# Patient Record
Sex: Female | Born: 1990 | Race: Black or African American | Hispanic: No | Marital: Married | Smoking: Never smoker
Health system: Southern US, Community
[De-identification: ages and names within clinical notes are randomized; demographics above are authoritative.]

## PROBLEM LIST (undated history)

## (undated) DIAGNOSIS — F419 Anxiety disorder, unspecified: Secondary | ICD-10-CM

---

## 2008-08-02 ENCOUNTER — Emergency Department (HOSPITAL_BASED_OUTPATIENT_CLINIC_OR_DEPARTMENT_OTHER): Admission: EM | Admit: 2008-08-02 | Discharge: 2008-08-03 | Payer: Self-pay | Admitting: Emergency Medicine

## 2008-08-03 ENCOUNTER — Ambulatory Visit: Payer: Self-pay | Admitting: Interventional Radiology

## 2010-04-10 LAB — DIFFERENTIAL
Basophils Absolute: 0 10*3/uL (ref 0.0–0.1)
Basophils Relative: 0 % (ref 0–1)
Lymphocytes Relative: 20 % (ref 12–46)
Monocytes Absolute: 0.7 10*3/uL (ref 0.1–1.0)
Monocytes Relative: 7 % (ref 3–12)
Neutro Abs: 8.2 10*3/uL — ABNORMAL HIGH (ref 1.7–7.7)
Neutrophils Relative %: 73 % (ref 43–77)

## 2010-04-10 LAB — URINALYSIS, ROUTINE W REFLEX MICROSCOPIC
Glucose, UA: NEGATIVE mg/dL
Ketones, ur: NEGATIVE mg/dL
Nitrite: NEGATIVE
Protein, ur: NEGATIVE mg/dL

## 2010-04-10 LAB — PREGNANCY, URINE: Preg Test, Ur: POSITIVE

## 2010-04-10 LAB — COMPREHENSIVE METABOLIC PANEL
Albumin: 3.5 g/dL (ref 3.5–5.2)
Alkaline Phosphatase: 45 U/L (ref 39–117)
BUN: 7 mg/dL (ref 6–23)
Creatinine, Ser: 0.6 mg/dL (ref 0.4–1.2)
Glucose, Bld: 97 mg/dL (ref 70–99)
Total Bilirubin: 0.2 mg/dL — ABNORMAL LOW (ref 0.3–1.2)
Total Protein: 7.1 g/dL (ref 6.0–8.3)

## 2010-04-10 LAB — CBC
HCT: 35 % — ABNORMAL LOW (ref 36.0–46.0)
Hemoglobin: 11.6 g/dL — ABNORMAL LOW (ref 12.0–15.0)
MCV: 83.6 fL (ref 78.0–100.0)
Platelets: 176 10*3/uL (ref 150–400)
RDW: 15.2 % (ref 11.5–15.5)

## 2010-04-10 LAB — RPR: RPR Ser Ql: NONREACTIVE

## 2010-04-10 LAB — WET PREP, GENITAL: Yeast Wet Prep HPF POC: NONE SEEN

## 2010-12-10 IMAGING — US US ABDOMEN COMPLETE
1 series · 14 of 25 positions shown · non-contrast
Comparison: None

CLINICAL DATA: Sharp abdominal pain.  Pregnant.

COMPLETE ABDOMINAL ULTRASOUND

[Series 1: us abdomen complete · 0.22mm/px · 14 of 69 slices shown]
[im 1/69]
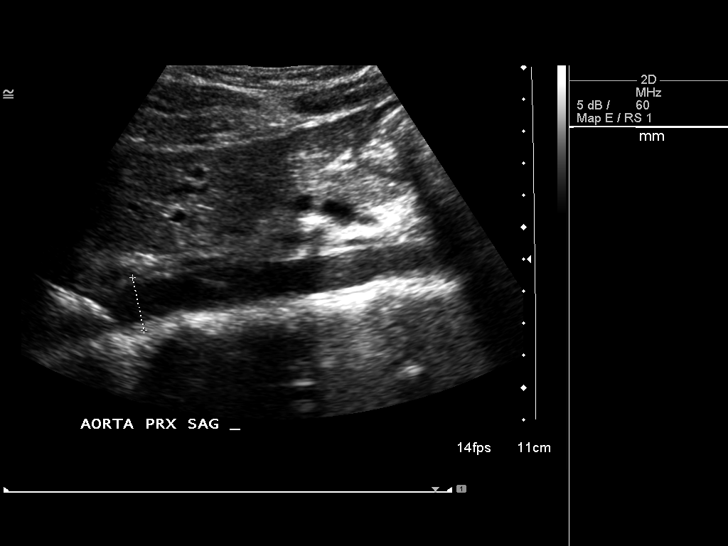
[im 6/69]
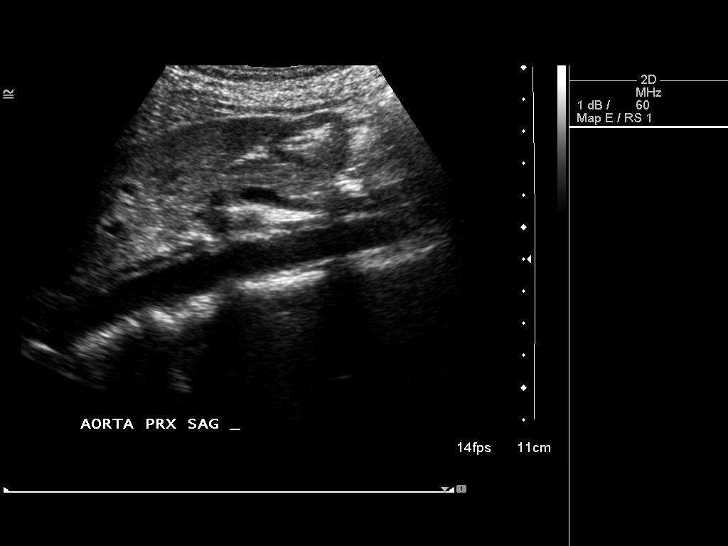
[im 12/69]
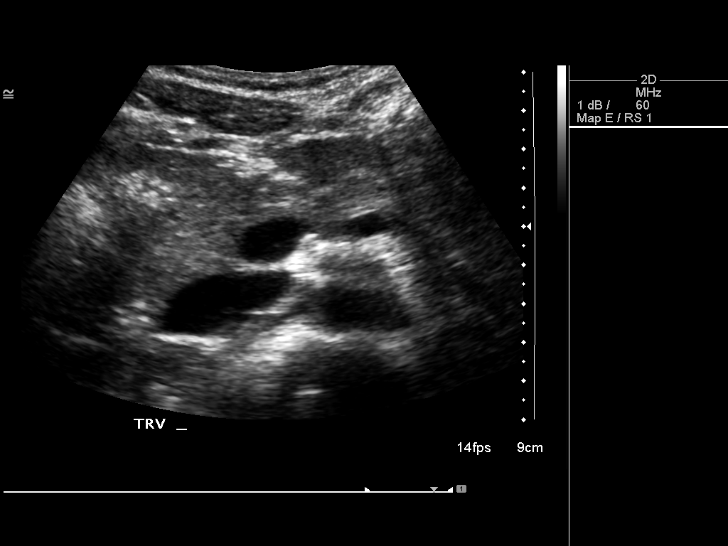
[im 18/69]
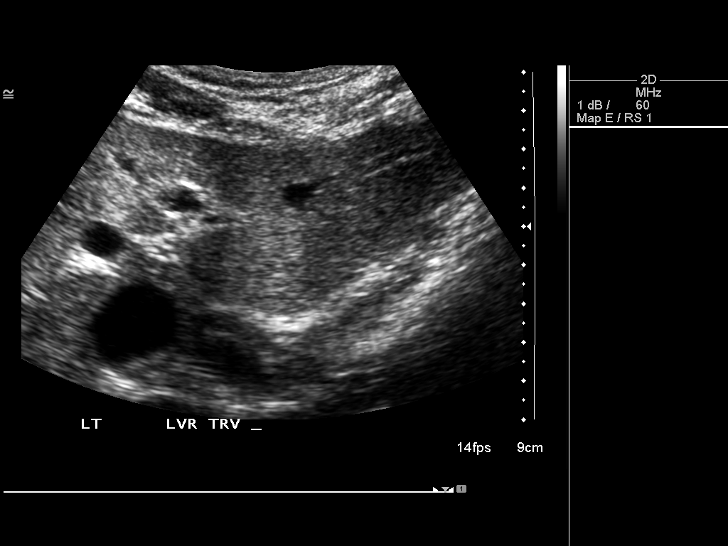
[im 23/69]
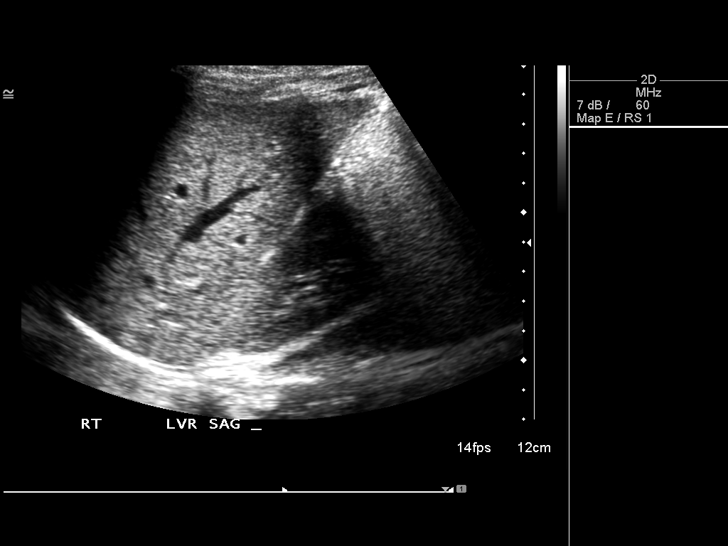
[im 26/69]
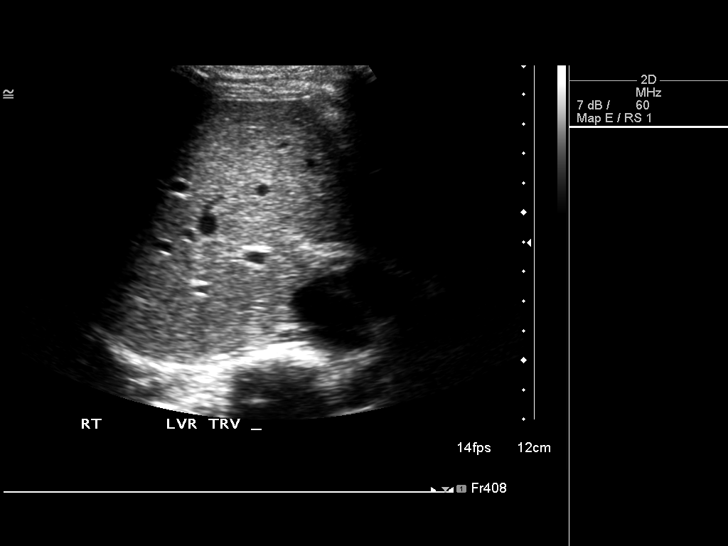
[im 32/69]
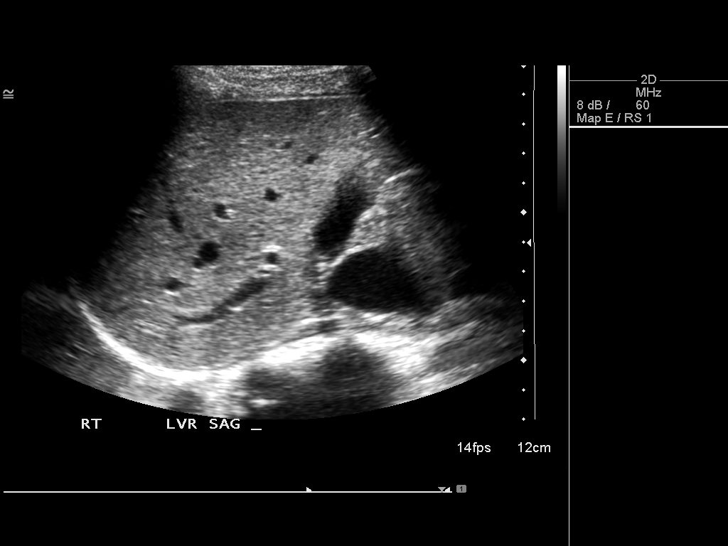
[im 37/69]
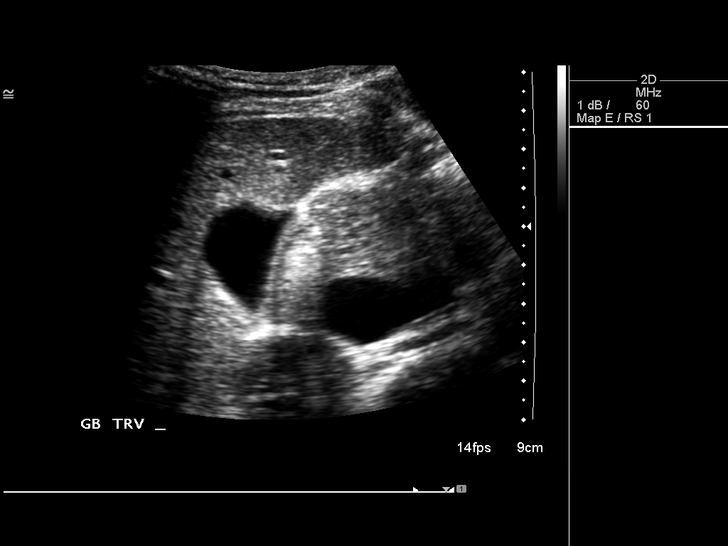
[im 43/69]
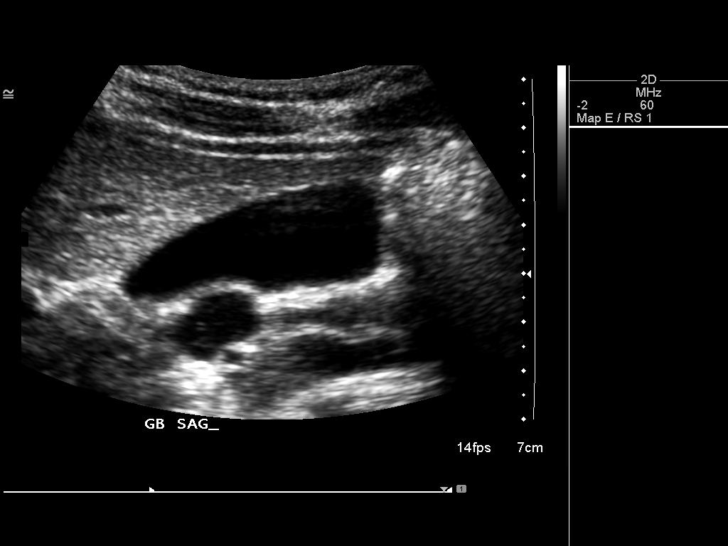
[im 46/69]
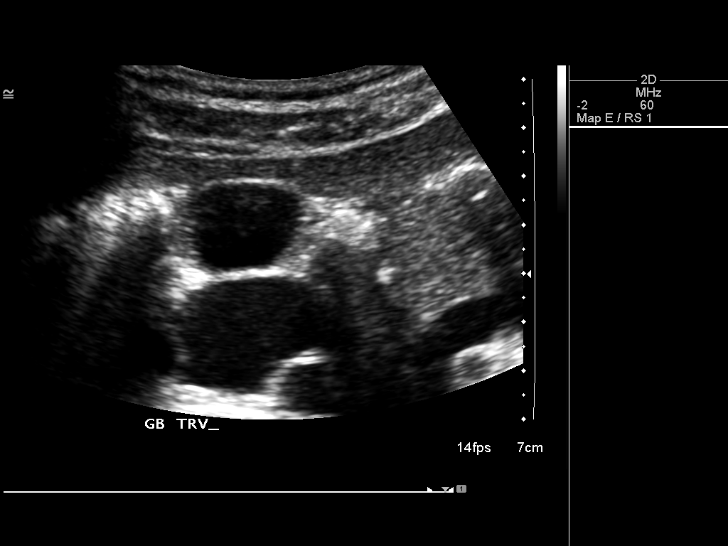
[im 52/69]
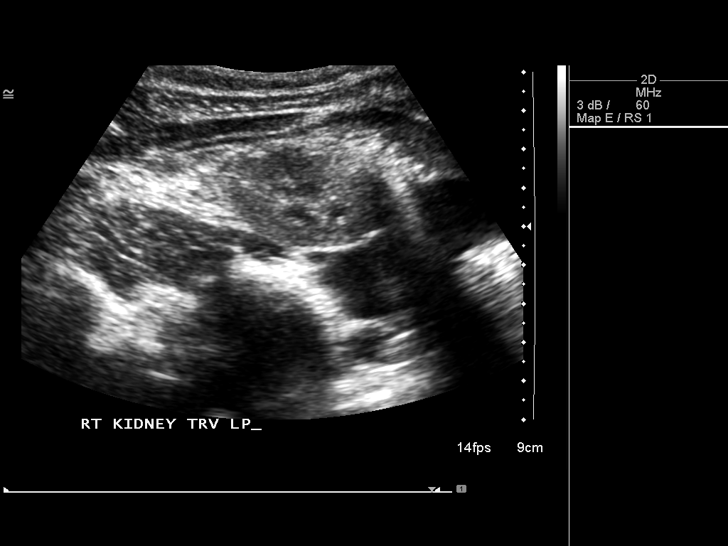
[im 57/69]
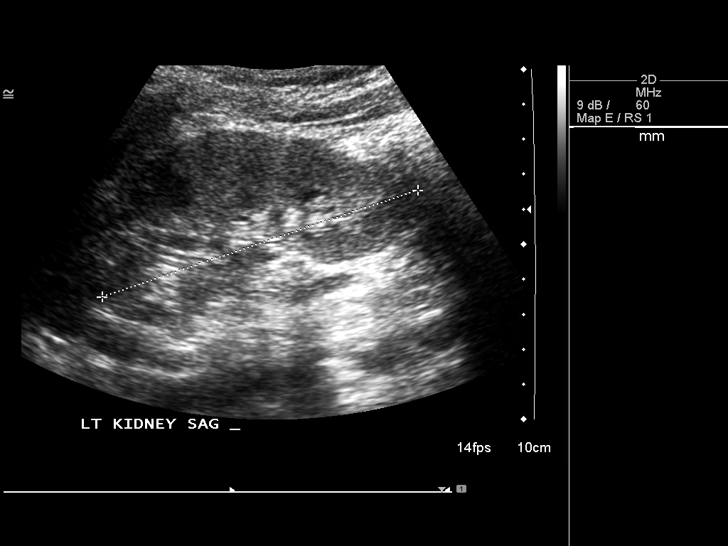
[im 63/69]
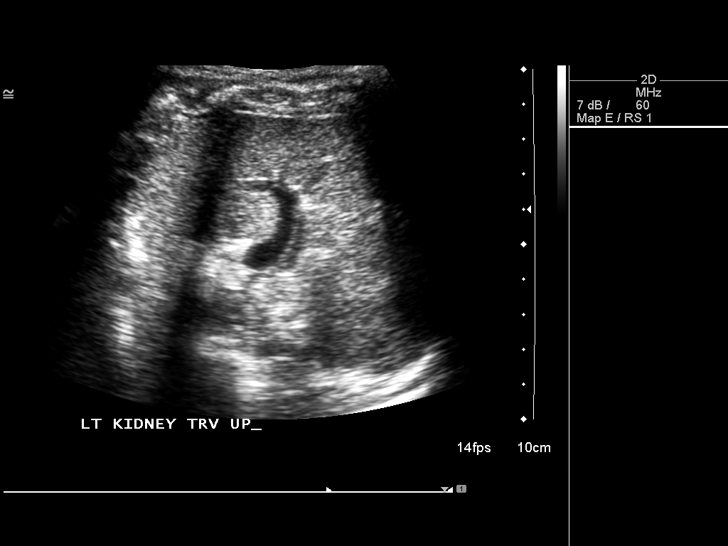
[im 69/69]
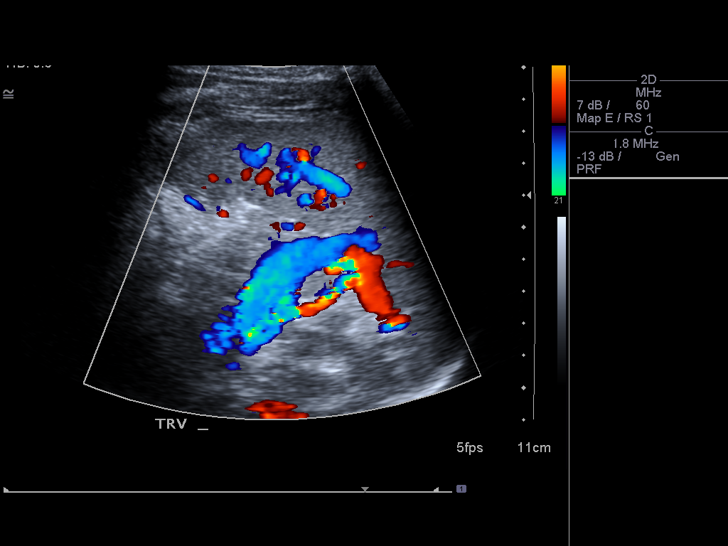

[14 of 25 positions shown; findings below may reference images not displayed]

FINDINGS: Gallbladder:  No shadowing gallstones or echogenic sludge.  No
gallbladder wall thickening or pericholecystic fluid.  Negative
sonographic Murphy's sign according to the ultrasound technologist.

Common bile duct:  Unremarkable

Liver:  Normal size and echotexture without focal parenchymal
abnormality.  Patent portal vein with hepatopetal flow.

IVC:  Visualized segments unremarkable.

Pancreas:  Although the pancreas is difficult to visualize in its
entirety, no focal pancreatic abnormality is identified.

Spleen:  Normal size and echotexture without focal parenchymal
abnormality.

Right Kidney:  There is mild right hydronephrosis.  No focal renal
lesions.  Right kidney measures at least 9.9 cm in length.

Left Kidney:  No hydronephrosis.  Well-preserved cortex.  Normal
size and parenchymal echotexture without focal abnormalities.  .

Abdominal aorta:  Normal in caliber throughout its visualized
course in the abdomen.
IMPRESSION: 1.  Normal gallbladder.
2.  Mild right hydronephrosis.

## 2015-11-01 ENCOUNTER — Encounter (HOSPITAL_COMMUNITY): Payer: Self-pay | Admitting: Emergency Medicine

## 2015-11-01 ENCOUNTER — Inpatient Hospital Stay (HOSPITAL_COMMUNITY)
Admission: AD | Admit: 2015-11-01 | Discharge: 2015-11-04 | DRG: 885 | Disposition: A | Discharge status: Home / Self Care | Payer: BLUE CROSS/BLUE SHIELD | Source: Intra-hospital | Attending: Psychiatry | Admitting: Psychiatry

## 2015-11-01 ENCOUNTER — Emergency Department (HOSPITAL_COMMUNITY)
Admission: EM | Admit: 2015-11-01 | Discharge: 2015-11-01 | Disposition: A | Discharge status: Other Acute Inpatient Hospital | Payer: BLUE CROSS/BLUE SHIELD | Attending: Emergency Medicine | Admitting: Emergency Medicine

## 2015-11-01 ENCOUNTER — Emergency Department (HOSPITAL_COMMUNITY): Payer: BLUE CROSS/BLUE SHIELD

## 2015-11-01 ENCOUNTER — Encounter (HOSPITAL_COMMUNITY): Payer: Self-pay | Admitting: *Deleted

## 2015-11-01 DIAGNOSIS — F332 Major depressive disorder, recurrent severe without psychotic features: Secondary | ICD-10-CM | POA: Diagnosis present

## 2015-11-01 DIAGNOSIS — R45851 Suicidal ideations: Secondary | ICD-10-CM | POA: Diagnosis present

## 2015-11-01 DIAGNOSIS — Z91018 Allergy to other foods: Secondary | ICD-10-CM | POA: Diagnosis not present

## 2015-11-01 DIAGNOSIS — Z79899 Other long term (current) drug therapy: Secondary | ICD-10-CM

## 2015-11-01 DIAGNOSIS — G4700 Insomnia, unspecified: Secondary | ICD-10-CM | POA: Diagnosis present

## 2015-11-01 DIAGNOSIS — Z91010 Allergy to peanuts: Secondary | ICD-10-CM | POA: Diagnosis not present

## 2015-11-01 DIAGNOSIS — F411 Generalized anxiety disorder: Secondary | ICD-10-CM | POA: Diagnosis present

## 2015-11-01 DIAGNOSIS — F410 Panic disorder [episodic paroxysmal anxiety] without agoraphobia: Secondary | ICD-10-CM | POA: Diagnosis present

## 2015-11-01 DIAGNOSIS — Z818 Family history of other mental and behavioral disorders: Secondary | ICD-10-CM | POA: Diagnosis not present

## 2015-11-01 HISTORY — DX: Anxiety disorder, unspecified: F41.9

## 2015-11-01 LAB — ETHANOL

## 2015-11-01 LAB — CBC WITH DIFFERENTIAL/PLATELET
BASOS ABS: 0.1 10*3/uL (ref 0.0–0.1)
BASOS PCT: 1 %
EOS ABS: 0.7 10*3/uL (ref 0.0–0.7)
EOS PCT: 7 %
HCT: 42.1 % (ref 36.0–46.0)
Hemoglobin: 14.2 g/dL (ref 12.0–15.0)
LYMPHS ABS: 2.2 10*3/uL (ref 0.7–4.0)
Lymphocytes Relative: 23 %
MCH: 29.4 pg (ref 26.0–34.0)
MCHC: 33.7 g/dL (ref 30.0–36.0)
MCV: 87.2 fL (ref 78.0–100.0)
Monocytes Absolute: 0.4 10*3/uL (ref 0.1–1.0)
Monocytes Relative: 5 %
Neutro Abs: 6.1 10*3/uL (ref 1.7–7.7)
Neutrophils Relative %: 64 %
PLATELETS: 165 10*3/uL (ref 150–400)
RBC: 4.83 MIL/uL (ref 3.87–5.11)
RDW: 12.6 % (ref 11.5–15.5)
WBC: 9.4 10*3/uL (ref 4.0–10.5)

## 2015-11-01 LAB — COMPREHENSIVE METABOLIC PANEL
ALK PHOS: 24 U/L — AB (ref 38–126)
ALT: 15 U/L (ref 14–54)
ANION GAP: 5 (ref 5–15)
AST: 17 U/L (ref 15–41)
Albumin: 4.4 g/dL (ref 3.5–5.0)
BILIRUBIN TOTAL: 1.3 mg/dL — AB (ref 0.3–1.2)
BUN: 9 mg/dL (ref 6–20)
CALCIUM: 9.2 mg/dL (ref 8.9–10.3)
CO2: 24 mmol/L (ref 22–32)
CREATININE: 0.89 mg/dL (ref 0.44–1.00)
Chloride: 109 mmol/L (ref 101–111)
Glucose, Bld: 91 mg/dL (ref 65–99)
Potassium: 3.6 mmol/L (ref 3.5–5.1)
Sodium: 138 mmol/L (ref 135–145)
TOTAL PROTEIN: 7.4 g/dL (ref 6.5–8.1)

## 2015-11-01 LAB — URINALYSIS, ROUTINE W REFLEX MICROSCOPIC
Bilirubin Urine: NEGATIVE
Glucose, UA: NEGATIVE mg/dL
HGB URINE DIPSTICK: NEGATIVE
Ketones, ur: NEGATIVE mg/dL
Nitrite: NEGATIVE
PH: 6 (ref 5.0–8.0)
Protein, ur: NEGATIVE mg/dL
SPECIFIC GRAVITY, URINE: 1.024 (ref 1.005–1.030)

## 2015-11-01 LAB — GLUCOSE, CAPILLARY: GLUCOSE-CAPILLARY: 116 mg/dL — AB (ref 65–99)

## 2015-11-01 LAB — RAPID URINE DRUG SCREEN, HOSP PERFORMED
AMPHETAMINES: NOT DETECTED
Barbiturates: NOT DETECTED
Benzodiazepines: POSITIVE — AB
COCAINE: NOT DETECTED
OPIATES: NOT DETECTED
Tetrahydrocannabinol: NOT DETECTED

## 2015-11-01 LAB — URINE MICROSCOPIC-ADD ON

## 2015-11-01 LAB — POC URINE PREG, ED: Preg Test, Ur: NEGATIVE

## 2015-11-01 LAB — ACETAMINOPHEN LEVEL

## 2015-11-01 LAB — SALICYLATE LEVEL

## 2015-11-01 MED ORDER — ESCITALOPRAM OXALATE 10 MG PO TABS
10.0000 mg | ORAL_TABLET | Freq: Every day | ORAL | Status: DC
  Administered 2015-11-01: 10 mg via ORAL
  Filled 2015-11-01: qty 1

## 2015-11-01 MED ORDER — HYDROXYZINE HCL 25 MG PO TABS
25.0000 mg | ORAL_TABLET | Freq: Four times a day (QID) | ORAL | Status: DC | PRN
  Administered 2015-11-01: 25 mg via ORAL
  Filled 2015-11-01: qty 1

## 2015-11-01 MED ORDER — CEPHALEXIN 500 MG PO CAPS
500.0000 mg | ORAL_CAPSULE | Freq: Once | ORAL | Status: AC
  Administered 2015-11-01: 500 mg via ORAL
  Filled 2015-11-01: qty 1

## 2015-11-01 MED ORDER — ESCITALOPRAM OXALATE 10 MG PO TABS
10.0000 mg | ORAL_TABLET | Freq: Every day | ORAL | Status: DC
  Administered 2015-11-02 – 2015-11-04 (×3): 10 mg via ORAL
  Filled 2015-11-01 (×5): qty 1

## 2015-11-01 MED ORDER — MAGNESIUM HYDROXIDE 400 MG/5ML PO SUSP: 30.0000 mL | Freq: Every day | ORAL | Status: DC | PRN

## 2015-11-01 MED ORDER — ALUM & MAG HYDROXIDE-SIMETH 200-200-20 MG/5ML PO SUSP: 30.0000 mL | ORAL | Status: DC | PRN

## 2015-11-01 MED ORDER — ACETAMINOPHEN 325 MG PO TABS: 650.0000 mg | ORAL_TABLET | Freq: Four times a day (QID) | ORAL | Status: DC | PRN

## 2015-11-01 NOTE — ED Notes (Signed)
PER MD-patient does not need sitter at present.  States family is at the bedside and patient is reasonable at present. Patient did voice SI to MD.  Patient undressed-in hospital gown at present for workup.  Belongings and patient and family members wanded by security.

## 2015-11-01 NOTE — ED Notes (Signed)
[facility] There were no belongings to return. Patient calm and cooperative.

## 2015-11-01 NOTE — BH Assessment (Signed)
[facility] Assessment Progress Note  Per Nanine MeansJamison Lord, DNP, this pt requires psychiatric hospitalization at this time.  Lillia AbedLindsay, RN, [facility]; they will be ready to receive pt at 14:00.  Pt has signed Voluntary Admission and Consent for Treatment, as well as Consent to Release Information to no one, and signed forms have been faxed to [facility].  Pt's nurse, Diane, has been notified, and agrees to send original paperwork along with pt via [facility], and to call report to [phone].  Doylene Canninghomas Chattie Greeson, MA Triage Specialist [phone]

## 2015-11-01 NOTE — ED Notes (Addendum)
When asked if patient had any thoughts of hurting herself patient became tearful.  States this week she recently found out ex-husband is seeing someone else.  Friend states that patient is not caring for things as normal.  Patient did not acknowledge question or answer when asked.  Family reports concerned that she may hurt herself.

## 2015-11-01 NOTE — ED Triage Notes (Signed)
Patient reports feeling anxious and having chest pain for the past few days.  States the pain in her chest is a "tightness".  Reports husband left her in January and she has had anxiety since then and she is now caring for her 2 kids. Mother and friend report patient has been more lethargic since last night and has been depressed as well.  States this has progressed over the past several months.

## 2015-11-01 NOTE — Progress Notes (Signed)
11/01/15 1400:  Pt was sleep.   Briana RancherMarjette Prabhleen Montemayor, LRT/CTRS

## 2015-11-01 NOTE — ED Provider Notes (Addendum)
[facility]-EMER[facility]ENCY DEPT Provider Note   CSN: [id number] Arrival date & time: 11/01/15  0827     History   Chief Complaint Chief Complaint  Patient presents with  . Chest Pain  . Anxiety  . Suicidal    HPI Briana Adams is a 25 y.o. female.   Anxiety  This is a recurrent problem. The current episode started more than 1 week ago. The problem occurs constantly. The problem has been gradually worsening. Associated symptoms include chest pain (associated with anxiety). Pertinent negatives include no abdominal pain. Nothing aggravates the symptoms. Nothing relieves the symptoms. She has tried nothing for the symptoms. The treatment provided no relief.    Past Medical History:  Diagnosis Date  . Anxiety     Patient Active Problem List   Diagnosis Date Noted  . Major depressive disorder, recurrent severe without psychotic features (HCC) 11/01/2015    History reviewed. No pertinent surgical history.  OB History    No data available       Home Medications    Prior to Admission medications   Medication Sig Start Date End Date Taking? Authorizing Provider  escitalopram (LEXAPRO) 10 MG tablet Take 10 mg by mouth daily.   Yes Historical Provider, MD  estradiol cypionate (DEPO-ESTRADIOL) 5 MG/ML injection Inject into the muscle.   Yes Historical Provider, MD    Family History History reviewed. No pertinent family history.  Social History Social History  Substance Use Topics  . Smoking status: Never Smoker  . Smokeless tobacco: Never Used  . Alcohol use No     Allergies   Peanut-containing drug products   Review of Systems Review of Systems  Constitutional: Negative for fever.  Cardiovascular: Positive for chest pain (associated with anxiety).  Gastrointestinal: Negative for abdominal pain.  Psychiatric/Behavioral: Positive for behavioral problems, dysphoric mood, sleep disturbance and suicidal ideas. The patient is nervous/anxious.   All other systems reviewed  and are negative.    Physical Exam Updated Vital Signs BP 110/67 (BP Location: Left Arm)   Pulse 72   Temp 98.1 F (36.7 C) (Oral)   Resp 16   LMP 10/02/2015 (Approximate)   SpO2 99%   Physical Exam  Constitutional: She is oriented to person, place, and time. She appears well-developed and well-nourished.  HENT:  Head: Normocephalic and atraumatic.  Eyes: Conjunctivae and EOM are normal.  Neck: Normal range of motion.  Cardiovascular: Normal rate and regular rhythm.   Pulmonary/Chest: No stridor. No respiratory distress.  Abdominal: She exhibits no distension. There is no tenderness.  Neurological: She is alert and oriented to person, place, and time. No cranial nerve deficit. Coordination normal.  Psychiatric: Her mood appears anxious. She is slowed and withdrawn. Cognition and memory are not impaired. She does not express impulsivity or inappropriate judgment. She expresses suicidal ideation. She expresses no suicidal plans.  Nursing note and vitals reviewed.    ED Treatments / Results  Labs (all labs ordered are listed, but only abnormal results are displayed) Labs Reviewed  COMPREHENSIVE METABOLIC PANEL - Abnormal; Notable for the following:       Result Value   Alkaline Phosphatase 24 (*)    Total Bilirubin 1.3 (*)    All other components within normal limits  RAPID URINE DRUG SCREEN, HOSP PERFORMED - Abnormal; Notable for the following:    Benzodiazepines POSITIVE (*)    All other components within normal limits  URINALYSIS, ROUTINE W REFLEX MICROSCOPIC (NOT AT [facility]) - Abnormal; Notable for the following:  Color, Urine AMBER (*)    APPearance CLOUDY (*)    Leukocytes, UA MODERATE (*)    All other components within normal limits  ACETAMINOPHEN LEVEL - Abnormal; Notable for the following:    Acetaminophen (Tylenol), Serum <10 (*)    All other components within normal limits  URINE MICROSCOPIC-ADD ON - Abnormal; Notable for the following:    Squamous Epithelial  / LPF 6-30 (*)    Bacteria, UA MANY (*)    All other components within normal limits  URINE CULTURE  ETHANOL  CBC WITH DIFFERENTIAL/PLATELET  SALICYLATE LEVEL  POC URINE PREG, ED    EKG  EKG Interpretation  Date/Time:  Monday November 01 2015 08:38:59 EDT Ventricular Rate:  86 PR Interval:    QRS Duration: 113 QT Interval:  369 QTC Calculation: 442 R Axis:   68 Text Interpretation:  Sinus rhythm Borderline intraventricular conduction delay Low voltage, precordial leads RSR' in V1 or V2, probably normal variant Baseline wander in lead(s) V4 V6 Confirmed by [facility] MD, Barbara CowerJASON [phone]([id number]) on 11/01/2015 9:58:13 AM       Radiology Dg Chest 2 View  Result Date: 11/01/2015 CLINICAL DATA:  Anxiety. EXAM: CHEST  2 VIEW COMPARISON:  No recent prior. FINDINGS: Mediastinum and hilar structures normal. Lungs are clear. Heart size normal. No pleural effusion or pneumothorax. No acute bony abnormality. IMPRESSION: No acute cardiopulmonary disease. Electronically Signed   By: Maisie Fushomas  Register   On: 11/01/2015 09:20    Procedures Procedures (including critical care time)  Medications Ordered in ED Medications  escitalopram (LEXAPRO) tablet 10 mg (10 mg Oral Given 11/01/15 1145)  hydrOXYzine (ATARAX/VISTARIL) tablet 25 mg (25 mg Oral Given 11/01/15 1148)  cephALEXin (KEFLEX) capsule 500 mg (500 mg Oral Given 11/01/15 1100)     Initial Impression / Assessment and Plan / ED Course  I have reviewed the triage vital signs and the nursing notes.  Pertinent labs & imaging results that were available during my care of the patient were reviewed by me and considered in my medical decision making (see chart for details).  Clinical Course   Chest pain likely 2/2 anxiety. PERC negative, doubt PE. Low suspicion for ACS.  Also with suicidal thougths, worsening depression since her husband left her. Has had thoughts of suicide to include wanting to hit a truck while driving down highway. Mom and friend  are also more concerned for her safety with multiple recent breakdowns and anxiety issues.   [facility] evaluated and will accept. Plan for transfer for same.   Final Clinical Impressions(s) / ED Diagnoses   Final diagnoses:  Major depressive disorder, recurrent severe without psychotic features [facility](HCC)     Marily MemosJason Samir Ishaq, MD 11/01/15 1510
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
## 2015-11-01 NOTE — Plan of Care (Signed)
Problem: Safety: Goal: Periods of time without injury will increase Outcome: Progressing Pt. remains a low fall risk, denies SI/HI at this time, Q 15 checks in place.    
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
## 2015-11-01 NOTE — ED Notes (Signed)
MD at bedside. 

## 2015-11-01 NOTE — Progress Notes (Signed)
PATIENT SIGNED 72 HR REQUEST FOR DISCHARGE ON 11/01/2015 AT 1738.

## 2015-11-01 NOTE — Progress Notes (Signed)
D: Pt. is up and visible in the room, resting in bed. Denies having any SI/HI/AVH/Pain at this time. Pt. presents with a depressed & anxious affect and mood. Pt. remains isolative to her room and is minimal with interaction. Snacks and fluids offered, Pt. declined both.   A: Encouragement and support given. No Meds. ordered at this time. PRN Vistaril requested and given. Will re-eval as necessary.   R: 1:1 interaction in private. Safety maintained with Q 15 checks. Continues to follow treatment plan and will monitor closely. No questions/concerns at this time.

## 2015-11-01 NOTE — Progress Notes (Signed)
Patient denied SI and HI, contracts for safety.  Denied A/V hallucinations  Denied pain.  Patient stated she has been very depressed with high anxiety, denied hopeless.  Patient will eat dinner in her room tonight.  Patient did sign 72 hr request for discharge tonight.

## 2015-11-01 NOTE — Tx Team (Signed)
Initial Treatment Plan 11/01/2015 4:52 PM Briana Adams Tardiff ZOX:[id number]    PATIENT STRESSORS: Marital or family conflict Traumatic event   PATIENT STRENGTHS: Average or above average intelligence Capable of independent living Communication skills Financial means Motivation for treatment/growth Supportive family/friends Work skills   PATIENT IDENTIFIED PROBLEMS: Depression  Anxiety  Suicidal Thoughts  "I feel worthless at times"  "I'm tired of feeling sad and depressed all the time."               DISCHARGE CRITERIA:  Adequate post-discharge living arrangements Need for constant or close observation no longer present Verbal commitment to aftercare and medication compliance  PRELIMINARY DISCHARGE PLAN: Outpatient therapy Return to previous work or school arrangements  PATIENT/FAMILY INVOLVEMENT: This treatment plan has been presented to and reviewed with the patient, Briana Adams Sharrar.  The patient and family have been given the opportunity to ask questions and make suggestions.  Cranford MonBeaudry, Beyounce Dickens Evans, RN 11/01/2015, 4:52 PM
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
## 2015-11-02 DIAGNOSIS — Z79899 Other long term (current) drug therapy: Secondary | ICD-10-CM

## 2015-11-02 DIAGNOSIS — F332 Major depressive disorder, recurrent severe without psychotic features: Principal | ICD-10-CM

## 2015-11-02 DIAGNOSIS — Z91010 Allergy to peanuts: Secondary | ICD-10-CM

## 2015-11-02 LAB — URINE CULTURE: Culture: <10000 — AB

## 2015-11-02 MED ORDER — TRAZODONE HCL 50 MG PO TABS
50.0000 mg | ORAL_TABLET | Freq: Every evening | ORAL | Status: DC | PRN
Start: 2015-11-02 — End: 2015-11-04
  Administered 2015-11-03: 50 mg via ORAL
  Filled 2015-11-02: qty 1

## 2015-11-02 NOTE — H&P (Signed)
Psychiatric Admission Assessment Adult  Patient Identification: Briana Adams MRN:  [id number] Date of Evaluation:  11/02/2015 Chief Complaint:   " I have been struggling with anxiety, depression" Principal Diagnosis:  MDD, no psychotic symptoms Diagnosis:   Patient Active Problem List   Diagnosis Date Noted  . Major depressive disorder, recurrent severe without psychotic features (HCC) [F33.2] 11/01/2015   History of Present Illness: 25 year old female. Presented to the ED for worsening anxiety and depression . Presented to ED with severe anxiety and a subjective sense of " chest tightness". States " I think it was a panic attack".  She reports feeling significant anxiety and depression recently, which she attributes to significant psychosocial stressors as below. Reports " I have a lot of stuff going on- I recently separated in January and he has another GF now". " I moved back to [place] after he left, to be closer to family". " A lot of recent changes".  She reports a history of anxiety + depression and states she had been prescribed Lexapro several months ago, which she feels helped . She stopped it about 6-8 months ago, as she was feeling better ( stopping was not related to any side effects) . She just restarted it two to three days ago.  Associated Signs/Symptoms: Depression Symptoms:  depressed mood, anhedonia, insomnia, suicidal thoughts without plan, anxiety, panic attacks, loss of energy/fatigue, decreased appetite,  Decreased sense of self esteem (Hypo) Manic Symptoms:   Denies  Anxiety Symptoms:  Describes increased anxiety, and panic attacks, feeling easily overwhelmed , denies agoraphobia. Psychotic Symptoms:  Denies  PTSD Symptoms: Denies  Total Time spent with patient: 45 minutes  Past Psychiatric History:no prior psychiatric admissions, no history of self cutting or self injurious behaviors, no history of suicide attempts, denies history of violence, denies  history of psychosis, denies history of PTSD, describes history of anxiety and panic attacks, worsened since her separation earlier this year.  As above, had been on Lexapro , and had been doing well on this medication -stopped it several months ago, has restarted it a few days ago.   Is the patient at risk to self? Yes.    Has the patient been a risk to self in the past 6 months? No.  Has the patient been a risk to self within the distant past? No.  Is the patient a risk to others? No.  Has the patient been a risk to others in the past 6 months? No.  Has the patient been a risk to others within the distant past? No.   Prior Inpatient Therapy:  No  Prior Outpatient Therapy:   No current outpatient treatment   Alcohol Screening: 1. How often do you have a drink containing alcohol?: Monthly or less 2. How many drinks containing alcohol do you have on a typical day when you are drinking?: 1 or 2 3. How often do you have six or more drinks on one occasion?: Never Preliminary Score: 0 9. Have you or someone else been injured as a result of your drinking?: No 10. Has a relative or friend or a doctor or another health worker been concerned about your drinking or suggested you cut down?: No Alcohol Use Disorder Identification Test Final Score (AUDIT): 1 Brief Intervention: AUDIT score less than 7 or less-screening does not suggest unhealthy drinking-brief intervention not indicated Substance Abuse History in the last 12 months:  Denies alcohol abuse, denies drug abuse  Consequences of Substance Abuse: Denies  Previous Psychotropic Medications:  Reports she has been prescribed Xanax in the past, more recently had tried on Lexapro, which she feels had helped in the past, but had stopped taking . Psychological Evaluations: No  Past Medical History:   Past Medical History:  Diagnosis Date  . Anxiety    History reviewed. No pertinent surgical history. Family History: Parents alive, divorced. Has  three siblings .  Family Psychiatric  History:  Grandmother has history of alcohol abuse, no suicides in family. States mother has history of depression . Tobacco Screening: Have you used any form of tobacco in the last 30 days? (Cigarettes, Smokeless Tobacco, Cigars, and/or Pipes): No Social History:  25 year old female, currently separated, lives with her two children, ages ( 726, 2 ) , employed, denies legal issues, states mother in Social workerlaw and mother are taking care of her children at this time.  Separation ( states he left her unexpectedly several months ago) and recent relocation are identified as major stressors.  History  Alcohol Use No     History  Drug Use No    Additional Social History:  Allergies:   Allergies  Allergen Reactions  . Peanut-Containing Drug Products Hives   Lab Results:  Results for orders placed or performed during the hospital encounter of 11/01/15 (from the past 48 hour(s))  Glucose, capillary     Status: Abnormal   Collection Time: 11/01/15  3:38 PM  Result Value Ref Range   Glucose-Capillary 116 (H) 65 - 99 mg/dL    Blood Alcohol level:  Lab Results  Component Value Date   ETH <5 11/01/2015    Metabolic Disorder Labs:  No results found for: HGBA1C, MPG No results found for: PROLACTIN No results found for: CHOL, TRIG, HDL, CHOLHDL, VLDL, LDLCALC  Current Medications: Current Facility-Administered Medications  Medication Dose Route Frequency Provider Last Rate Last Dose  . acetaminophen (TYLENOL) tablet 650 mg  650 mg Oral Q6H PRN Charm RingsJamison Y Lord, NP      . alum & mag hydroxide-simeth (MAALOX/MYLANTA) 200-200-20 MG/5ML suspension 30 mL  30 mL Oral Q4H PRN Charm RingsJamison Y Lord, NP      . escitalopram (LEXAPRO) tablet 10 mg  10 mg Oral Daily Charm RingsJamison Y Lord, NP   10 mg at 11/02/15 0814  . hydrOXYzine (ATARAX/VISTARIL) tablet 25 mg  25 mg Oral Q6H PRN Charm RingsJamison Y Lord, NP   25 mg at 11/01/15 2128  . magnesium hydroxide (MILK OF MAGNESIA) suspension 30 mL  30 mL  Oral Daily PRN Charm RingsJamison Y Lord, NP       PTA Medications: Prescriptions Prior to Admission  Medication Sig Dispense Refill Last Dose  . escitalopram (LEXAPRO) 10 MG tablet Take 10 mg by mouth daily.   10/31/2015 at Unknown time  . estradiol cypionate (DEPO-ESTRADIOL) 5 MG/ML injection Inject into the muscle.   08/05/2015    Musculoskeletal: Strength & Muscle Tone: within normal limits Gait & Station: normal Patient leans: N/A  Psychiatric Specialty Exam: Physical Exam  Review of Systems  Constitutional: Negative.   HENT: Negative.   Eyes: Negative.   Respiratory: Negative.   Cardiovascular: Positive for chest pain.       Associated with anxiety, currently resolved    Gastrointestinal: Positive for nausea. Negative for abdominal pain, blood in stool, constipation, diarrhea, melena and vomiting.  Genitourinary: Negative.   Musculoskeletal: Negative.   Skin: Negative.   Neurological: Negative for seizures.  Endo/Heme/Allergies: Negative.   Psychiatric/Behavioral: Positive for depression. The patient is nervous/anxious.   All other systems  reviewed and are negative.   Blood pressure 124/78, pulse 94, temperature 98.7 F (37.1 C), temperature source Oral, resp. rate 18, height 5' 4.5" (1.638 m), weight 126 lb (57.2 kg), last menstrual period 10/02/2015.Body mass index is 21.29 kg/m.  General Appearance: Well Groomed  Eye Contact:  Good  Speech:  Normal Rate  Volume:  Normal  Mood:  Anxious and Depressed  Affect:  constricted, but reactive  Thought Process:  Linear  Orientation:  Full (Time, Place, and Person)  Thought Content:  no hallucinations, no delusions, not internally preoccupied   Suicidal Thoughts:  No denies any suicidal or self injurious ideations , contracts for safety on the unit   Homicidal Thoughts:  No  Memory:  recent and remote grossly intact   Judgement:  Good  Insight:  Good  Psychomotor Activity:  Normal  Concentration:  Concentration: Good and  Attention Span: Good  Recall:  recent and remote grossly intact   Fund of Knowledge:  Good  Language:  Good  Akathisia:  No  Handed:  Right  AIMS (if indicated):     Assets:  Communication Skills Desire for Improvement Resilience  ADL's:  Intact  Cognition:  WNL  Sleep:       Treatment Plan Summary: Daily contact with patient to assess and evaluate symptoms and progress in treatment, Medication management, Plan inpatient admission  and medications as below  Observation Level/Precautions:  15 minute checks  Laboratory:  as needed   Psychotherapy:  Milieu, support   Medications:  At this time continue LEXAPRO which she had been on in the past with good result and no side effects. Continue LEXAPRO 10 mgrs QDAY  VISTARIL PRNs for anxiety, TRAZODONE PRN for insomnia  Consultations:  As needed    Discharge Concerns:  -   Estimated LOS: 5 days   Other:     Physician Treatment Plan for Primary Diagnosis:  MDD , no psychotic features  Long Term Goal(s): Improvement in symptoms so as ready for discharge  Short Term Goals: Ability to verbalize feelings will improve, Ability to disclose and discuss suicidal ideas and Ability to identify and develop effective coping behaviors will improve  Physician Treatment Plan for Secondary Diagnosis: Active Problems:   Major depressive disorder, recurrent severe without psychotic features (HCC)  Long Term Goal(s): Improvement in symptoms so as ready for discharge  Short Term Goals: Ability to verbalize feelings will improve, Ability to disclose and discuss suicidal ideas and Ability to identify and develop effective coping behaviors will improve  I certify that inpatient services furnished can reasonably be expected to improve the patient's condition.    Nehemiah Massed, MD 10/31/20173:09 PM

## 2015-11-02 NOTE — Progress Notes (Signed)
D:Pt has been isolative to her room today. Pt reports that her biggest stressor is the breakup with her husband of seven years and having her children see his girlfriend. Pt says that she has church support and girlfriends. She has a hard time talking about her breakup with her friends and church family. Pt says that she would like to talk to a therapist outpatient but is worried about the cost. A:Supported pt to discuss feelings. Offered encouragement and 15 minute checks.  R:Pt denies si and hi. Safety maintained on the unit.

## 2015-11-02 NOTE — [facility] Suicide Risk Assessment (Signed)
[facility]   Nursing information obtained from:   patient and chart Demographic factors:   25 year old female, employed, two children, separated Current Mental Status:   see below Loss Factors:   separation, relocation Historical Factors:   history of anxiety, depression  Risk Reduction Factors:   employment, sense of responsibility to children   Total Time spent with patient:  45 minutes  Principal Problem:  MDD  Diagnosis:   Patient Active Problem List   Diagnosis Date Noted  . Major depressive disorder, recurrent severe without psychotic features (HCC) [F33.2] 11/01/2015     Continued Clinical Symptoms:  Alcohol Use Disorder Identification Test Final Score (AUDIT): 1 The "Alcohol Use Disorders Identification Test", Guidelines for Use in Primary Care, Second Edition.  World Science writerHealth Organization [facility](WHO). Score between 0-7:  no or low risk or alcohol related problems. Score between 8-15:  moderate risk of alcohol related problems. Score between 16-19:  high risk of alcohol related problems. Score 20 or above:  warrants further diagnostic evaluation for alcohol dependence and treatment.   CLINICAL FACTORS:   25 year old female, reports worsening anxiety, depression, panic attacks, in the context of significant psychosocial stressors, including separation from husband a few months ago and relocation . She had  stopped taking antidepressant medication several months ago.      Psychiatric Specialty Exam: Physical Exam  ROS  Blood pressure 124/78, pulse 94, temperature 98.7 F (37.1 C), temperature source Oral, resp. rate 18, height 5' 4.5" (1.638 m), weight 126 lb (57.2 kg), last menstrual period 10/02/2015.Body mass index is 21.29 kg/m.   see admit note MSE   COGNITIVE FEATURES THAT CONTRIBUTE TO RISK:  Closed-mindedness and Loss of executive function    SUICIDE RISK:   Moderate:  Frequent suicidal ideation with limited intensity, and duration, some  specificity in terms of plans, no associated intent, good self-control, limited dysphoria/symptomatology, some risk factors present, and identifiable protective factors, including available and accessible social support.   PLAN OF CARE: Patient will be admitted to inpatient psychiatric unit for stabilization and safety. Will provide and encourage milieu participation. Provide medication management and maked adjustments as needed.  Will follow daily.    I certify that inpatient services furnished can reasonably be expected to improve the patient's condition.  Briana MassedOBOS, Briana Scifres, MD 11/02/2015, 4:49 PM

## 2015-11-02 NOTE — Plan of Care (Signed)
Problem: Activity: Goal: Sleeping patterns will improve Outcome: Not Progressing Pt sleeps on and off during the day and has difficulty sleeping at night. Pt was encouraged to stay up more during the day.

## 2015-11-02 NOTE — [facility] Group Notes (Signed)
[facility] LCSW Group Therapy  11/02/2015   1:15 PM   Type of Therapy:  Group Therapy  Participation Level:  Active  Participation Quality:  Attentive, Sharing and Supportive  Affect:  Blunted   Cognitive:  Alert and Oriented  Insight:  Developing/Improving and Engaged  Engagement in Therapy:  Developing/Improving and Engaged  Modes of Intervention:  Clarification, Confrontation, Discussion, Education, Exploration, Limit-setting, Orientation, Problem-solving, Rapport Building, Dance movement psychotherapisteality Testing, Socialization and Support  Summary of Progress/Problems: The topic for group therapy was feelings about diagnosis.  Pt actively participated in group discussion on their past and current diagnosis and how they feel towards this.  Pt also identified how society and family members judge them, based on their diagnosis as well as stereotypes and stigmas.  Patient participated minimally in discussion but stated that this experience has helped her to be more in tune with her symptoms and know how to help someone else dealing with depression or anxiety in the future. She attributes some of her anxiety to feeling a loss of control.    Briana Adams, MSW, LCSW Clinical Social Worker [facility] [phone]

## 2015-11-03 LAB — TSH: TSH: 1.074 u[IU]/mL (ref 0.350–4.500)

## 2015-11-03 NOTE — [facility] Suicide Risk Assessment (Signed)
[facility] INPATIENT:  Family/Significant Other Suicide Prevention Education  Suicide Prevention Education:  Patient Refusal for Family/Significant Other Suicide Prevention Education: The patient Briana Adams has refused to provide written consent for family/significant other to be provided Family/Significant Other Suicide Prevention Education during admission and/or prior to discharge.  Physician notified. SPE reviewed with patient and brochure provided. Patient encouraged to return to hospital if having suicidal thoughts, patient verbalized his/her understanding and has no further questions at this time.   Wolfe Camarena L Biviana Saddler 11/03/2015, 9:02 AM

## 2015-11-03 NOTE — Progress Notes (Signed)
D:  Patient's self inventory sheet, patient has fair sleep,  No sleep medication given.  Fair appetite, normal energy level, good concentration.  Rated depression 2, denied hopeless, anxiety 6.  Denied withdrawals.  Denied SI.  Physical problems, pain, worst pain #1 in past 24 hours,alittle tightness in check across middle, mild.  No pain medicine.  Goal is to lean more on faith.  Plans to have prayer.  Does have discharge plans. A:  Medications administered per MD orders.  Emotional support and encouragement given patient. R:  Denied SI and HI, contracts for safety.  Denied A/V hallucinations.  Safety maintained with 15 minute checks.

## 2015-11-03 NOTE — Progress Notes (Signed)
EKG COMPLETED AND PUT ON MD DESK FOR REVIEW.  

## 2015-11-03 NOTE — Plan of Care (Signed)
Problem: Education: Goal: Ability to state activities that reduce stress will improve Outcome: Progressing Nurse discussed anxiety/coping skills with patient.    

## 2015-11-03 NOTE — [facility] Counselor (Signed)
Adult Comprehensive Assessment  Patient ID: Briana Adams, female   DOB: [birth year], 25 y.o.   MRN: [id number]  Information Source: Information source: Patient  Current Stressors:  Educational / Learning stressors: Taking online classes to get her B.A. in [employer], reports that she may be taking on too much Employment / Job issues: Works as a Public librarianbank manager. Enjoys the job though it can be stressful at times Family Relationships: reports a good relationship with mother and brother but states that relationship with father is more distant. Relationship stressors with husband who she is separated from Financial / Lack of resources (include bankruptcy): Reports having financial stressors but states that she is able to meet her basic needs Housing / Lack of housing: Lives in an apartment with her 2 children in [place] since January 2017 Physical health (include injuries & life threatening diseases): Denies Social relationships: Denies Substance abuse: Denies Bereavement / Loss: Separation from husband since January 2017  Living/Environment/Situation:  Living Arrangements: Children Living conditions (as described by patient or guardian): Lives in an apartment with her 2 children in [place] since January 2017 What is atmosphere in current home: Comfortable  Family History:  Marital status: Separated Separated, when?: January 2017 Does patient have children?: Yes How many children?: 2 How is patient's relationship with their children?: Reports a good relationship with 507 y.o. son and 1.5 yr old daughter  Childhood History:  By whom was/is the patient raised?: Mother Description of patient's relationship with caregiver when they were a child: Parents divorced when patient was 25 y.o. Good relationship with mother, more distant relationship with father Patient's description of current relationship with people who raised him/her: Good relationship with mother, more distant  relationship with father Does patient have siblings?: Yes Number of Siblings: 3 Description of patient's current relationship with siblings: No relationship with 2 younger half-siblings but reports a typical sibling relationshipw ith her older brother that she talks to regularly Did patient suffer any verbal/emotional/physical/sexual abuse as a child?: No Did patient suffer from severe childhood neglect?: No Has patient ever been sexually abused/assaulted/raped as an adolescent or adult?: No Was the patient ever a victim of a crime or a disaster?: No Witnessed domestic violence?: No Has patient been effected by domestic violence as an adult?: No  Education:  Highest grade of school patient has completed: Some college- taking classes to get her B.A. in [employer] Currently a student?: Yes Name of school: [employer] online program How long has the patient attended?: 1 semester Learning disability?: No  Employment/Work Situation:   Employment situation: Employed Where is patient currently employed?: Works as a Public librarianbank manager. Enjoys the job though it can be stressful at times How long has patient been employed?: 9 years with bank in various capacities Patient's job has been impacted by current illness: No What is the longest time patient has a held a job?: 9 years Where was the patient employed at that time?: current employer Has patient ever been in the [employer] and Companymilitary?: No  Financial Resources:   Financial resources: Income from employment Does patient have a representative payee or guardian?: No  Alcohol/Substance Abuse:   What has been your use of drugs/alcohol within the last 12 months?: denies If attempted suicide, did drugs/alcohol play a role in this?: No Alcohol/Substance Abuse Treatment Hx: Denies past history Has alcohol/substance abuse ever caused legal problems?: No  Social Support System:   Patient's Community Support System: Good Describe Community Support  System: Reports family support Type of faith/religion: [employer]  Witnesses How does patient's faith help to cope with current illness?: reports struggling with her faith recently due to life stressors  Leisure/Recreation:   Leisure and Hobbies: watching tv, reading  Strengths/Needs:   What things does the patient do well?: organized, dedicated, good listener, good mother In what areas does patient struggle / problems for patient: anxiety  Discharge Plan:   Does patient have access to transportation?: Yes Will patient be returning to same living situation after discharge?: Yes Currently receiving community mental health services: No If no, would patient like referral for services when discharged?: Yes (What county?) ([place] or surrounding areas) Does patient have financial barriers related to discharge medications?: No  Summary/Recommendations:     Patient is a 25 year old female who presented to the hospital with Major Depressive Disorder. Primary triggers for admission include relationship and financial stressors. Patient will benefit from crisis stabilization, medication evaluation, group therapy and psycho education in addition to case management for discharge planning. At discharge, it is recommended that Pt remain compliant with established discharge plan and continued treatment.   Gilberta Peeters L Calven Gilkes. 11/03/2015

## 2015-11-03 NOTE — Progress Notes (Signed)
D: Pt is flat and withdrawn to room; remained in bed all night. Pt endorses moderate depression. Pt however, denies; SI, HI, depression pain or AVH. Pt made minimal interaction.  A: Support, encouragement, and safe environment provided.  15-minute safety checks continue.  R: Pt had no schedule 2200 medication.  Pt did not attend group. Safety checks continue.

## 2015-11-03 NOTE — Tx Team (Signed)
Interdisciplinary Treatment and Diagnostic Plan Update  11/03/2015 Time of Session: 9:30am Briana Adams MRN: [id number]  Principal Diagnosis: Major depressive disorder, recurrent severe without psychotic features [facility](HCC)  Secondary Diagnoses: Principal Problem:   Major depressive disorder, recurrent severe without psychotic features (HCC)   Current Medications:  Current Facility-Administered Medications  Medication Dose Route Frequency Provider Last Rate Last Dose  . acetaminophen (TYLENOL) tablet 650 mg  650 mg Oral Q6H PRN Charm RingsJamison Y Lord, NP      . alum & mag hydroxide-simeth (MAALOX/MYLANTA) 200-200-20 MG/5ML suspension 30 mL  30 mL Oral Q4H PRN Charm RingsJamison Y Lord, NP      . escitalopram (LEXAPRO) tablet 10 mg  10 mg Oral Daily Charm RingsJamison Y Lord, NP   10 mg at 11/03/15 0818  . hydrOXYzine (ATARAX/VISTARIL) tablet 25 mg  25 mg Oral Q6H PRN Charm RingsJamison Y Lord, NP   25 mg at 11/01/15 2128  . magnesium hydroxide (MILK OF MAGNESIA) suspension 30 mL  30 mL Oral Daily PRN Charm RingsJamison Y Lord, NP      . traZODone (DESYREL) tablet 50 mg  50 mg Oral QHS PRN Craige CottaFernando A Cobos, MD       PTA Medications: Prescriptions Prior to Admission  Medication Sig Dispense Refill Last Dose  . escitalopram (LEXAPRO) 10 MG tablet Take 10 mg by mouth daily.   10/31/2015 at Unknown time  . estradiol cypionate (DEPO-ESTRADIOL) 5 MG/ML injection Inject into the muscle.   08/05/2015    Patient Stressors: Marital or family conflict Traumatic event  Patient Strengths: Average or above average intelligence Capable of independent living Licensed conveyancerCommunication skills Financial means Motivation for treatment/growth Supportive family/friends Work skills  Treatment Modalities: Medication Management, Group therapy, Case management,  1 to 1 session with clinician, Psychoeducation, Recreational therapy.   Physician Treatment Plan for Primary Diagnosis: Major depressive disorder, recurrent severe without psychotic features (HCC) Long Term  Goal(s): Improvement in symptoms so as ready for discharge Improvement in symptoms so as ready for discharge   Short Term Goals: Ability to verbalize feelings will improve Ability to disclose and discuss suicidal ideas Ability to identify and develop effective coping behaviors will improve Ability to verbalize feelings will improve Ability to disclose and discuss suicidal ideas Ability to identify and develop effective coping behaviors will improve  Medication Management: Evaluate patient's response, side effects, and tolerance of medication regimen.  Therapeutic Interventions: 1 to 1 sessions, Unit Group sessions and Medication administration.  Evaluation of Outcomes: Adequate for Discharge  Physician Treatment Plan for Secondary Diagnosis: Principal Problem:   Major depressive disorder, recurrent severe without psychotic features (HCC)  Long Term Goal(s): Improvement in symptoms so as ready for discharge Improvement in symptoms so as ready for discharge   Short Term Goals: Ability to verbalize feelings will improve Ability to disclose and discuss suicidal ideas Ability to identify and develop effective coping behaviors will improve Ability to verbalize feelings will improve Ability to disclose and discuss suicidal ideas Ability to identify and develop effective coping behaviors will improve     Medication Management: Evaluate patient's response, side effects, and tolerance of medication regimen.  Therapeutic Interventions: 1 to 1 sessions, Unit Group sessions and Medication administration.  Evaluation of Outcomes: Adequate for Discharge   RN Treatment Plan for Primary Diagnosis: Major depressive disorder, recurrent severe without psychotic features (HCC) Long Term Goal(s): Knowledge of disease and therapeutic regimen to maintain health will improve  Short Term Goals: Ability to remain free from injury will improve, Ability to disclose and discuss suicidal  ideas, Ability to  identify and develop effective coping behaviors will improve and Compliance with prescribed medications will improve  Medication Management: RN will administer medications as ordered by provider, will assess and evaluate patient's response and provide education to patient for prescribed medication. RN will report any adverse and/or side effects to prescribing provider.  Therapeutic Interventions: 1 on 1 counseling sessions, Psychoeducation, Medication administration, Evaluate responses to treatment, Monitor vital signs and CBGs as ordered, Perform/monitor CIWA, COWS, AIMS and Fall Risk screenings as ordered, Perform wound care treatments as ordered.  Evaluation of Outcomes: Adequate for Discharge   LCSW Treatment Plan for Primary Diagnosis: Major depressive disorder, recurrent severe without psychotic features (HCC) Long Term Goal(s): Safe transition to appropriate next level of care at discharge, Engage patient in therapeutic group addressing interpersonal concerns.  Short Term Goals: Engage patient in aftercare planning with referrals and resources, Increase social support, Increase emotional regulation, Identify triggers associated with mental health/substance abuse issues and Increase skills for wellness and recovery  Therapeutic Interventions: Assess for all discharge needs, 1 to 1 time with Social worker, Explore available resources and support systems, Assess for adequacy in community support network, Educate family and significant other(s) on suicide prevention, Complete Psychosocial Assessment, Interpersonal group therapy.  Evaluation of Outcomes: Adequate for Discharge   Progress in Treatment :  Attending groups: Continuing to assess  Participating in groups: Continuing to assess  Taking medication as prescribed: Yes, MD continuing to assess for appropriate medication regimen  Toleration medication: Yes  Family/Significant other contact made: No, patient has declined for  collateral contact   Patient understands diagnosis: Yes  Discussing patient identified problems/goals with staff: Yes  Medical problems stabilized or resolved: Yes  Denies suicidal/homicidal ideation: Yes, denies  Issues/concerns per patient self-inventory: None reported  Other: N/A  New problem(s) identified: None reported at this time    New Short Term/Long Term Goal(s): None at this time    Discharge Plan or Barriers: Patient plans to return home to follow up with outpatient services.     Reason for Continuation of Hospitalization: Anxiety Depression Medication stabilization Suicidal Ideations Withdrawal symptoms  Estimated Length of Stay: Discharge anticipated for Thursday Nov. 2nd as her 72 hour request expires.    Attendees:  Patient:              Physician: Dr. Jama Flavorsobos, Dr. Mckinley Jewelates MD  11/03/2015   9:30am  Nursing: Ida RogueKaren Castro, [address]Christa Dopson, Criss RosalesBeverly Knight, Donna, RN          11/03/2015 9:30am  RN Care Manager: Onnie BoerJennifer Clark, CM  11/03/2015 9:30am  Social Workers: Chad CordialLauren Carter, LCSW, Samuella BruinKristin Onyx Edgley, LCSW, BellwoodHeather Smart, LCSW   11/03/2015 9:30am  Nurse Pratictioners: Gray BernhardtMay Augustin, NP, Claudette Headonrad Withrow, NP 11/03/2015 9:30am   Scribe for Treatment Team: Samuella BruinKristin Georgi Tuel, LCSW Clinical Social Worker [facility] [phone]

## 2015-11-03 NOTE — [facility] Group Notes (Signed)
[facility] LCSW Group Therapy 11/03/2015  1:15 pm   Type of Therapy: Group Therapy Participation Level: Active  Participation Quality: Attentive, Sharing and Supportive  Affect: Appropriate  Cognitive: Alert and Oriented  Insight: Developing/Improving and Engaged  Engagement in Therapy: Developing/Improving and Engaged  Modes of Intervention: Clarification, Confrontation, Discussion, Education, Exploration, Limit-setting, Orientation, Problem-solving, Rapport Building, Dance movement psychotherapisteality Testing, Socialization and Support  Summary of Progress/Problems: The topic for group was balance in life. Today's group focused on defining balance in one's own words, identifying things that can knock one off balance, and exploring healthy ways to maintain balance in life. Group members were asked to provide an example of a time when they felt off balance, describe how they handled that situation,and process healthier ways to regain balance in the future. Group members were asked to share the most important tool for maintaining balance that [facility] and how they plan to apply this method after discharge. Patient discussed feeling overwhelmed as she takes on too many responsibilities and "feels like I'm juggling glass and rubber balls- I don't know which ones to drop."  Samuella BruinKristin Kao Conry, MSW, LCSW Clinical Social Worker [facility] [phone]

## 2015-11-03 NOTE — Progress Notes (Signed)
Recreation Therapy Notes  Date: 11/03/15 Time: 0930 Location: [address] Dayroom  Group Topic: Stress Management  Goal Area(s) Addresses:  Patient will verbalize importance of using healthy stress management.  Patient will identify positive emotions associated with healthy stress management.   Intervention: Calm App  Activity :  Letting Go Meditation.  LRT introduced the stress management technique of meditation.  LRT played a meditation on letting go from the Callm App to get patients engaged in the activity.  Patients were to follow along with the meditation as it played.  Education:  Stress Management, Discharge Planning.   Education Outcome: Acknowledges edcuation/In group clarification offered/Needs additional education  Clinical Observations/Feedback: Pt did not attend group.     Deslyn Cavenaugh, LRT/CTRS         Heidie Krall A 11/03/2015 11:25 AM 

## 2015-11-03 NOTE — Progress Notes (Addendum)
[facility] MD Progress Note  75/06/4330 9:51 PM Briana Adams  MRN:  [id number] Subjective:   Patient reports feeling partially better today. She states she feels less depressed. She is future oriented, and focusing on being discharged soon in order to return to her children and work. Denies medication side effects. Objective : I have discussed case with treatment team and have met with patient. Today presents vaguely depressed, but less constricted and with a more reactive affect, smiles at times appropriately. Although still focusing on psychosocial stressors, mainly separation from husband and recently finding out that he is now living with another woman, she states she has been able to think more clearly since admission and is " ready to" focus more on herself and her well being . No disruptive or agitated behaviors on unit, going to groups. At this time tolerating medications well .   Principal Problem: Major depressive disorder, recurrent severe without psychotic features ([place]) Diagnosis:   Patient Active Problem List   Diagnosis Date Noted  . Major depressive disorder, recurrent severe without psychotic features ([place]) [F33.2] 11/01/2015   Total Time spent with patient: 20 minutes  Past Medical History:  Past Medical History:  Diagnosis Date  . Anxiety    History reviewed. No pertinent surgical history. Family History: History reviewed. No pertinent family history.  Social History:  History  Alcohol Use No     History  Drug Use No    Social History   Social History  . Marital status: Married    Spouse name: N/A  . Number of children: N/A  . Years of education: N/A   Social History Main Topics  . Smoking status: Never Smoker  . Smokeless tobacco: Never Used  . Alcohol use No  . Drug use: No  . Sexual activity: Not Asked   Other Topics Concern  . None   Social History Narrative  . None   Additional Social History:   Sleep: Good  Appetite:  Good  Current  Medications: Current Facility-Administered Medications  Medication Dose Route Frequency Provider Last Rate Last Dose  . acetaminophen (TYLENOL) tablet 650 mg  650 mg Oral Q6H PRN Patrecia Pour, NP      . alum & mag hydroxide-simeth (MAALOX/MYLANTA) 200-200-20 MG/5ML suspension 30 mL  30 mL Oral Q4H PRN Patrecia Pour, NP      . escitalopram (LEXAPRO) tablet 10 mg  10 mg Oral Daily Patrecia Pour, NP   10 mg at 11/03/15 0818  . hydrOXYzine (ATARAX/VISTARIL) tablet 25 mg  25 mg Oral Q6H PRN Patrecia Pour, NP   25 mg at 11/01/15 2128  . magnesium hydroxide (MILK OF MAGNESIA) suspension 30 mL  30 mL Oral Daily PRN Patrecia Pour, NP      . traZODone (DESYREL) tablet 50 mg  50 mg Oral QHS PRN Jenne Campus, MD        Lab Results:  Results for orders placed or performed during the hospital encounter of 11/01/15 (from the past 48 hour(s))  TSH     Status: None   Collection Time: 11/03/15  6:15 AM  Result Value Ref Range   TSH 1.074 0.350 - 4.500 uIU/mL    Comment: Performed by a 3rd Generation assay with a functional sensitivity of <=0.01 uIU/mL. Performed at [facility]     Blood Alcohol level:  Lab Results  Component Value Date   [facility] <5 01/60/1093    Metabolic Disorder Labs: No results found for: HGBA1C,  MPG No results found for: PROLACTIN No results found for: CHOL, TRIG, HDL, CHOLHDL, VLDL, LDLCALC  Physical Findings: AIMS: Facial and Oral Movements Muscles of Facial Expression: None, normal Lips and Perioral Area: None, normal Jaw: None, normal Tongue: None, normal,Extremity Movements Upper (arms, wrists, hands, fingers): None, normal Lower (legs, knees, ankles, toes): None, normal, Trunk Movements Neck, shoulders, hips: None, normal, Overall Severity Severity of abnormal movements (highest score from questions above): None, normal Incapacitation due to abnormal movements: None, normal Patient's awareness of abnormal movements (rate only patient's  report): No Awareness, Dental Status Current problems with teeth and/or dentures?: No Does patient usually wear dentures?: No  CIWA:  CIWA-Ar Total: 1 COWS:  COWS Total Score: 1  Musculoskeletal: Strength & Muscle Tone: within normal limits Gait & Station: normal Patient leans: N/A  Psychiatric Specialty Exam: Physical Exam  ROS denies headache, denies chest pain, denies shortness of breath, no vomiting , no fever, no chills, no dysuria, no frequency   Blood pressure 114/77, pulse 89, temperature 100 F (37.8 C), temperature source Oral, resp. rate 18, height 5' 4.5" (1.638 m), weight 126 lb (57.2 kg), last menstrual period 10/02/2015.Body mass index is 21.29 kg/m.  General Appearance: Well Groomed  Eye Contact:  Good  Speech:  Normal Rate  Volume:  Normal  Mood:  improving, states she is feeling better, still presents depressed  Affect:  mildly constricted, but fully reactive, smiles briefly at times appropriately   Thought Process:  Linear  Orientation:  Full (Time, Place, and Person)  Thought Content:  denies hallucinations, no delusions, not internally preoccupied   Suicidal Thoughts:  No- at this time denies any suicidal or self injurious ideations, and denies any homicidal or violent ideations   Homicidal Thoughts:  No  Memory:  recentnand remote grossly intact  Judgement:  Good  Insight:  Good  Psychomotor Activity:  Normal  Concentration:  Concentration: Good and Attention Span: Good  Recall:  Good  Fund of Knowledge:  Good  Language:  Good  Akathisia:  Negative  Handed:  Right  AIMS (if indicated):     Assets:  Desire for Improvement Resilience  ADL's:  Intact  Cognition:  WNL  Sleep:  Number of Hours: 6.75   Assessment - patient reports feeling better , is future oriented, and looking forward to discharge in order to reunite with her children ( currently with her mother and mother in law) and to return to work. She does continues to present vaguely depressed and  with a constricted affect. No suicidal ideations. Tolerating medications well thus far.  Treatment Plan  Summary: Daily contact with patient to assess and evaluate symptoms and progress in treatment, Medication management, Plan inpatient treatment  and medications as below Encourage ongoing group and milieu participation to work on coping skills and symptom reduction Continue  Lexapro 10 mgrs QDAY for depression Continue Trazodone 50 mgrs QHS PRN for insomnia as needed  Continue Vistaril 25 mgrs Q 6 hours PRN for anxiety Treatment team working on disposition planning  Neita Garnet, MD 11/03/2015, 4:05 PM

## 2015-11-04 MED ORDER — HYDROXYZINE HCL 25 MG PO TABS: 25.0000 mg | ORAL_TABLET | Freq: Four times a day (QID) | ORAL | 0 refills | Status: AC | PRN

## 2015-11-04 MED ORDER — TRAZODONE HCL 50 MG PO TABS: 50.0000 mg | ORAL_TABLET | Freq: Every evening | ORAL | 0 refills | Status: AC | PRN

## 2015-11-04 MED ORDER — ESCITALOPRAM OXALATE 10 MG PO TABS: 10.0000 mg | ORAL_TABLET | Freq: Every day | ORAL | 0 refills | Status: AC

## 2015-11-04 NOTE — [facility] Group Notes (Signed)
Patient did not attend group.

## 2015-11-04 NOTE — [facility] Suicide Risk Assessment (Signed)
[facility]   Principal Problem: Major depressive disorder, recurrent severe without psychotic features [facility](HCC) Discharge Diagnoses:  Patient Active Problem List   Diagnosis Date Noted  . Major depressive disorder, recurrent severe without psychotic features (HCC) [F33.2] 11/01/2015    Total Time spent with patient: 30 minutes  Musculoskeletal: Strength & Muscle Tone: within normal limits Gait & Station: normal Patient leans: N/A  Psychiatric Specialty Exam: ROS  Blood pressure 106/75, pulse 91, temperature 99 F (37.2 C), temperature source Oral, resp. rate 18, height 5' 4.5" (1.638 m), weight 126 lb (57.2 kg), last menstrual period 10/02/2015.Body mass index is 21.29 kg/m.  General Appearance: Well Groomed  Eye Contact::  Good  Speech:  Normal Rate409  Volume:  Normal  Mood:  improved, states her mood is much better than prior to admission   Affect:  still vaguely constricted, but reactive , smiles at times appropriately  Thought Process:  Linear  Orientation:  Full (Time, Place, and Person)  Thought Content:  no hallucinations, no delusions, not internally preoccupied   Suicidal Thoughts:  No denies suicidal or self injurious ideations, denies any homicidal or violent ideations  Homicidal Thoughts:  No  Memory:  recent and remote grossly intact   Judgement:  Other:  improved   Insight:  improved   Psychomotor Activity:  Normal  Concentration:  Good  Recall:  Good  Fund of Knowledge:Good  Language: Good  Akathisia:  Negative  Handed:  Right  AIMS (if indicated):     Assets:  Communication Skills Desire for Improvement Resilience Vocational/Educational  Sleep:  Number of Hours: 6.5  Cognition: WNL  ADL's:  Intact   Mental Status Per Nursing Assessment::   On Admission:     Demographic Factors:  25 year old female, employed, two children  Loss Factors: Separation from husband several months ago, recent relocation   Historical  Factors: History of depression, no prior psychiatric admissions, no history of suicide attempts  Risk Reduction Factors:   Responsible for children under 27 years of age, Sense of responsibility to family, Employed, Living with another person, especially a relative and Positive coping skills or problem solving skills  Continued Clinical Symptoms:  At this time patient is improved compared to admission- presents fully alert, attentive, well related, mood improved, less depressed, states she is feeling better, affect more reactive, no thought disorder, no suicidal or self injurious ideations, no homicidal or violent ideations, future oriented. Denies medication side effects- currently on Lexapro trial, which she had been on before with good response and tolerance   Cognitive Features That Contribute To Risk:  No gross cognitive deficits noted upon discharge. Is alert , attentive, and oriented x 3   Suicide Risk:  Mild:  Suicidal ideation of limited frequency, intensity, duration, and specificity.  There are no identifiable plans, no associated intent, mild dysphoria and related symptoms, good self-control (both objective and subjective assessment), few other risk factors, and identifiable protective factors, including available and accessible social support.  Follow-up Information    [facility] Follow up on 11/08/2015.   Why:  Therapy appointment with [facility] on Monday Nov. 6th at 10:30am. They will get you set up for medication management appt with Vikki PortsValerie at this time. Call office if you need to reschedule. Bring insurance card.  Contact information: [phone] [address] [place], [place] [address]          Plan Of Care/Follow-up recommendations:  Activity:  as tolerated  Diet:  regular Tests:  NA Other:  see below  Patient is requesting discharge and there are no current grounds for involuntary commitment. Patient is leaving unit in good spirits, looking  forward to returning home and to work soon. Follow up as above   Nehemiah MassedOBOS, Destyni Hoppel, MD 11/04/2015, 1:35 PM

## 2015-11-04 NOTE — Progress Notes (Addendum)
Data. Patient denies SI/HI/AVH. Her affect is bright on approach. She reports feeling anxious about her impending discharge, but also, "Very happy". Patient interacting well with staff and other patients. On her self assessment patient reports 1/10 for depression, 0/10 for hopelessness and 5/10 for anxiety. Her goal for today is: "Going home".  Action. Emotional support and encouragement offered. Education provided on medication, indications and side effect. Q 15 minute checks done for safety. Response. Safety on the unit maintained through 15 minute checks.  Medications taken as prescribed. Attended groups. Remained calm and appropriate through out shift.  Patient discharged to lobby. Belongings sheet reviewed and signed by patient and all belongings, including scripts, sent home. Paperwork reviewed and patient able to verbalize understanding of education. Patient in no current distress and ambulatory.

## 2015-11-04 NOTE — Discharge Summary (Signed)
Physician Discharge Summary Note  Patient:  Briana Adams is an 25 y.o., female MRN:  [id number] DOB:  [birth year] Patient phone:  [phone] (home)  Patient address:   [address] [place] [address],  Total Time spent with patient: 45 minutes  Date of Admission:  11/01/2015 Date of Discharge: 11/04/15  Reason for Admission:   25 year old female. Presented to the ED for worsening anxiety and depression . Presented to ED with severe anxiety and a subjective sense of " chest tightness". States " I think it was a panic attack". She reports feeling significant anxiety and depression recently, which she attributes to significant psychosocial stressors as below. Reports " I have a lot of stuff going on- I recently separated in January and he has another GF now". " [place] after [place] left, to be closer to family". " A lot of recent changes".  She reports a history of anxiety + depression and states she had been prescribed Lexapro several months ago, which she feels helped . She stopped it about 6-8 months ago, as she was feeling better ( stopping was not related to any side effects) . She just restarted it two to three days ago.  Principal Problem: Major depressive disorder, recurrent severe without psychotic features [facility](HCC) Discharge Diagnoses: Patient Active Problem List   Diagnosis Date Noted  . Major depressive disorder, recurrent severe without psychotic features (HCC) [F33.2] 11/01/2015    Past Psychiatric History: See H&P  Past Medical History:  Past Medical History:  Diagnosis Date  . Anxiety    History reviewed. No pertinent surgical history. Family History: History reviewed. No pertinent family history. Family Psychiatric  History: See H&P Social History:  History  Alcohol Use No     History  Drug Use No    Social History   Social History  . Marital status: Married    Spouse name: N/A  . Number of children: N/A  . Years of education: N/A    Social History Main Topics  . Smoking status: Never Smoker  . Smokeless tobacco: Never Used  . Alcohol use No  . Drug use: No  . Sexual activity: Not Asked   Other Topics Concern  . None   Social History Narrative  . None    Hospital Course:   Briana Adams was admitted for Major depressive disorder, recurrent severe without psychotic features (HCC) , and crisis management.  Pt was treated discharged with the medications listed below under Medication List.  Medical problems were identified and treated as needed.  Home medications were restarted as appropriate.  Improvement was monitored by observation and Briana Adams 's daily report of symptom reduction.  Emotional and mental status was monitored by daily self-inventory reports completed by Briana FearsAlicia Adams and clinical staff.         Briana Adams was evaluated by the treatment team for stability and plans for continued recovery upon discharge. Briana Adams 's Adams was an integral factor for scheduling further treatment. Employment, transportation, bed availability, health status, family support, and any pending legal issues were also considered during hospital stay. Pt was offered further treatment options upon discharge including but not limited to Residential, Intensive Outpatient, and Outpatient treatment.  Briana Adams will follow up with the services as listed below under Follow Up Information.     Upon completion of this admission the patient was both mentally and medically stable for discharge denying suicidal/homicidal ideation, auditory/visual/tactile hallucinations, delusional thoughts and paranoia.  Briana Fears responded well to treatment with trazodone, lexapro, vistaril without adverse effects. Pt demonstrated improvement without reported or observed adverse effects to the point of stability appropriate for outpatient management. Pertinent labs include: UDS+ benzo. Reviewed CBC, CMP, BAL, and UDS; all unremarkable aside  from noted exceptions.   Physical Findings: AIMS: Facial and Oral Movements Muscles of Facial Expression: None, normal Lips and Perioral Area: None, normal Jaw: None, normal Tongue: None, normal,Extremity Movements Upper (arms, wrists, hands, fingers): None, normal Lower (legs, knees, ankles, toes): None, normal, Trunk Movements Neck, shoulders, hips: None, normal, Overall Severity Severity of abnormal movements (highest score from questions above): None, normal Incapacitation due to abnormal movements: None, normal Patient's awareness of abnormal movements (rate only patient's report): No Awareness, Dental Status Current problems with teeth and/or dentures?: No Does patient usually wear dentures?: No  CIWA:  CIWA-Ar Total: 1 COWS:  COWS Total Score: 1  Musculoskeletal: Strength & Muscle Tone: within normal limits Gait & Station: normal Patient leans: N/A  Psychiatric Specialty Exam: Physical Exam  Review of Systems  Psychiatric/Behavioral: Positive for depression. Negative for suicidal ideas. The patient is nervous/anxious and has insomnia.   All other systems reviewed and are negative.   Blood pressure 106/75, pulse 91, temperature 99 F (37.2 C), temperature source Oral, resp. rate 18, height 5' 4.5" (1.638 m), weight 57.2 kg (126 lb), last menstrual period 10/02/2015.Body mass index is 21.29 kg/m.  SEE MD PSE WITHIN THE SRA  Have you used any form of tobacco in the last 30 days? (Cigarettes, Smokeless Tobacco, Cigars, and/or Pipes): No  Has this patient used any form of tobacco in the last 30 days? (Cigarettes, Smokeless Tobacco, Cigars, and/or Pipes) No  Blood Alcohol level:  Lab Results  Component Value Date   ETH <5 11/01/2015    Metabolic Disorder Labs:  No results found for: HGBA1C, MPG No results found for: PROLACTIN No results found for: CHOL, TRIG, HDL, CHOLHDL, VLDL, LDLCALC  See Psychiatric Specialty Exam and Suicide Risk Assessment completed by  Attending Physician prior to discharge.  Discharge destination:  Home  Is patient on multiple antipsychotic therapies at discharge:  No   Has Patient had three or more failed trials of antipsychotic monotherapy by history:  No  Recommended Plan for Multiple Antipsychotic Therapies: NA     Medication List    STOP taking these medications   estradiol cypionate 5 MG/ML injection Commonly known as:  DEPO-ESTRADIOL     TAKE these medications     Indication  escitalopram 10 MG tablet Commonly known as:  LEXAPRO Take 1 tablet (10 mg total) by mouth daily. Start taking on:  11/05/2015  Indication:  Major Depressive Disorder   hydrOXYzine 25 MG tablet Commonly known as:  ATARAX/VISTARIL Take 1 tablet (25 mg total) by mouth every 6 (six) hours as needed for anxiety.  Indication:  Anxiety Neurosis   traZODone 50 MG tablet Commonly known as:  DESYREL Take 1 tablet (50 mg total) by mouth at bedtime as needed for sleep.  Indication:  Trouble Sleeping      Follow-up Information    [facility] Follow up on 11/08/2015.   Why:  Therapy appointment with Murlean Iba on Monday Nov. 6th at 10:30am. They will get you set up for medication management appt with Vikki Ports at this time. Call office if you need to reschedule. Bring insurance card.  Contact information: [phone] [address] [place], [place] [address]          Follow-up recommendations:  Activity:  As tolerated Diet:  Heart healthy with low sodium.  Comments:   Take all medications as prescribed. Keep all follow-up appointments as scheduled.  Do not consume alcohol or use illegal drugs while on prescription medications. Report any adverse effects from your medications to your primary care provider promptly.  In the event of recurrent symptoms or worsening symptoms, call 911, a crisis hotline, or go to the nearest emergency department for evaluation.   Signed: Beau FannyWithrow, Kason Benak C, FNP 11/04/2015, 10:45 AM

## 2015-11-04 NOTE — Progress Notes (Deleted)
Data. Patient denies SI/HI/AVH.   Patient interacting well with staff and other patients.  Action. Emotional support and encouragement offered. Education provided on medication, indications and side effect. Q 15 minute checks done for safety. Response. Safety on the unit maintained through 15 minute checks.  Medications taken as prescribed. Attended groups. Remained calm and appropriate through out shift.  Pt. discharged to lobby.  Belongings sheet reviewed and signed by pt. and all belongings sent home. Paperwork reviewed and pt. able to verbalize understanding of education. Pt. in no current distress and ambulatory. 

## 2015-11-04 NOTE — Progress Notes (Addendum)
  [facility] Discharge Plan :  Will you be returning to the same living situation after discharge:  Yes,  patient plans to return home At discharge, do you have transportation home?: Yes,  friends/family will pick up Do you have the ability to pay for your medications: Yes,  patient will be provided with prescriptions at discharge  Release of information consent forms completed and in the chart;  Patient's signature needed at discharge.  Patient to Follow up at: Follow-up Information    [facility] Follow up on 11/08/2015.   Why:  Therapy appointment with Murlean IbaGlen Zelnak on Monday Nov. 6th at 10:30am. They will get you set up for medication management appt with Vikki PortsValerie at this time. Call office if you need to reschedule. Bring insurance card.  Contact information: [phone] [address] [place], [place] [address]          Next level of care provider has access to [facility] Link:no  Safety Planning and Suicide Prevention discussed: Yes,  with patient  Have you used any form of tobacco in the last 30 days? (Cigarettes, Smokeless Tobacco, Cigars, and/or Pipes): No  Has patient been referred to the Quitline?: N/A  Patient has been referred for addiction treatment: N/A  Elba Schaber L Paitynn Mikus 11/04/2015, 10:09 AM

## 2015-11-04 NOTE — [facility] Group Notes (Addendum)
Adult Psychoeducational Group Note  Date:  11/04/2015 Time:  0900  Group Topic/Focus:  Goals Group:   The focus of this group is to help patients establish daily goals to achieve during treatment and discuss how the patient can incorporate goal setting into their daily lives to aide in recovery.  Participation Level:  Active  Participation Quality:  Appropriate  Engagement in Group:  Engaged  Modes of Intervention:  Discussion and Education   Additional information: Patient set goal to manage her anxiety today.  Danasia Baker P Tavaras Goody 11/04/2015, 1:33 PM 

## 2015-11-04 NOTE — Progress Notes (Signed)
Pt has been observed talking on the phone frequently this evening.  She appears to be talking with her family, particularly her children.  She reports she had a good day and is looking forward to being discharged some time Thursday.  She realizes that she needs to take care of herself and her children, and to continue her studies.  She appears to be goal oriented.  She feels the medications are helpful.  Pt denies SI/HI/AVH.  She was encouraged to make her needs known to staff.  Pt is polite and appropriate.  Support and encouragement offered. Discharge plans are in process.  Safety maintained with q15 minute checks.

## 2018-03-09 IMAGING — CR DG CHEST 2V
2 series · 2 of 2 positions shown · non-contrast
Comparison: No recent prior.

CLINICAL DATA: Anxiety.

EXAM:
CHEST  2 VIEW

[w chest pa]
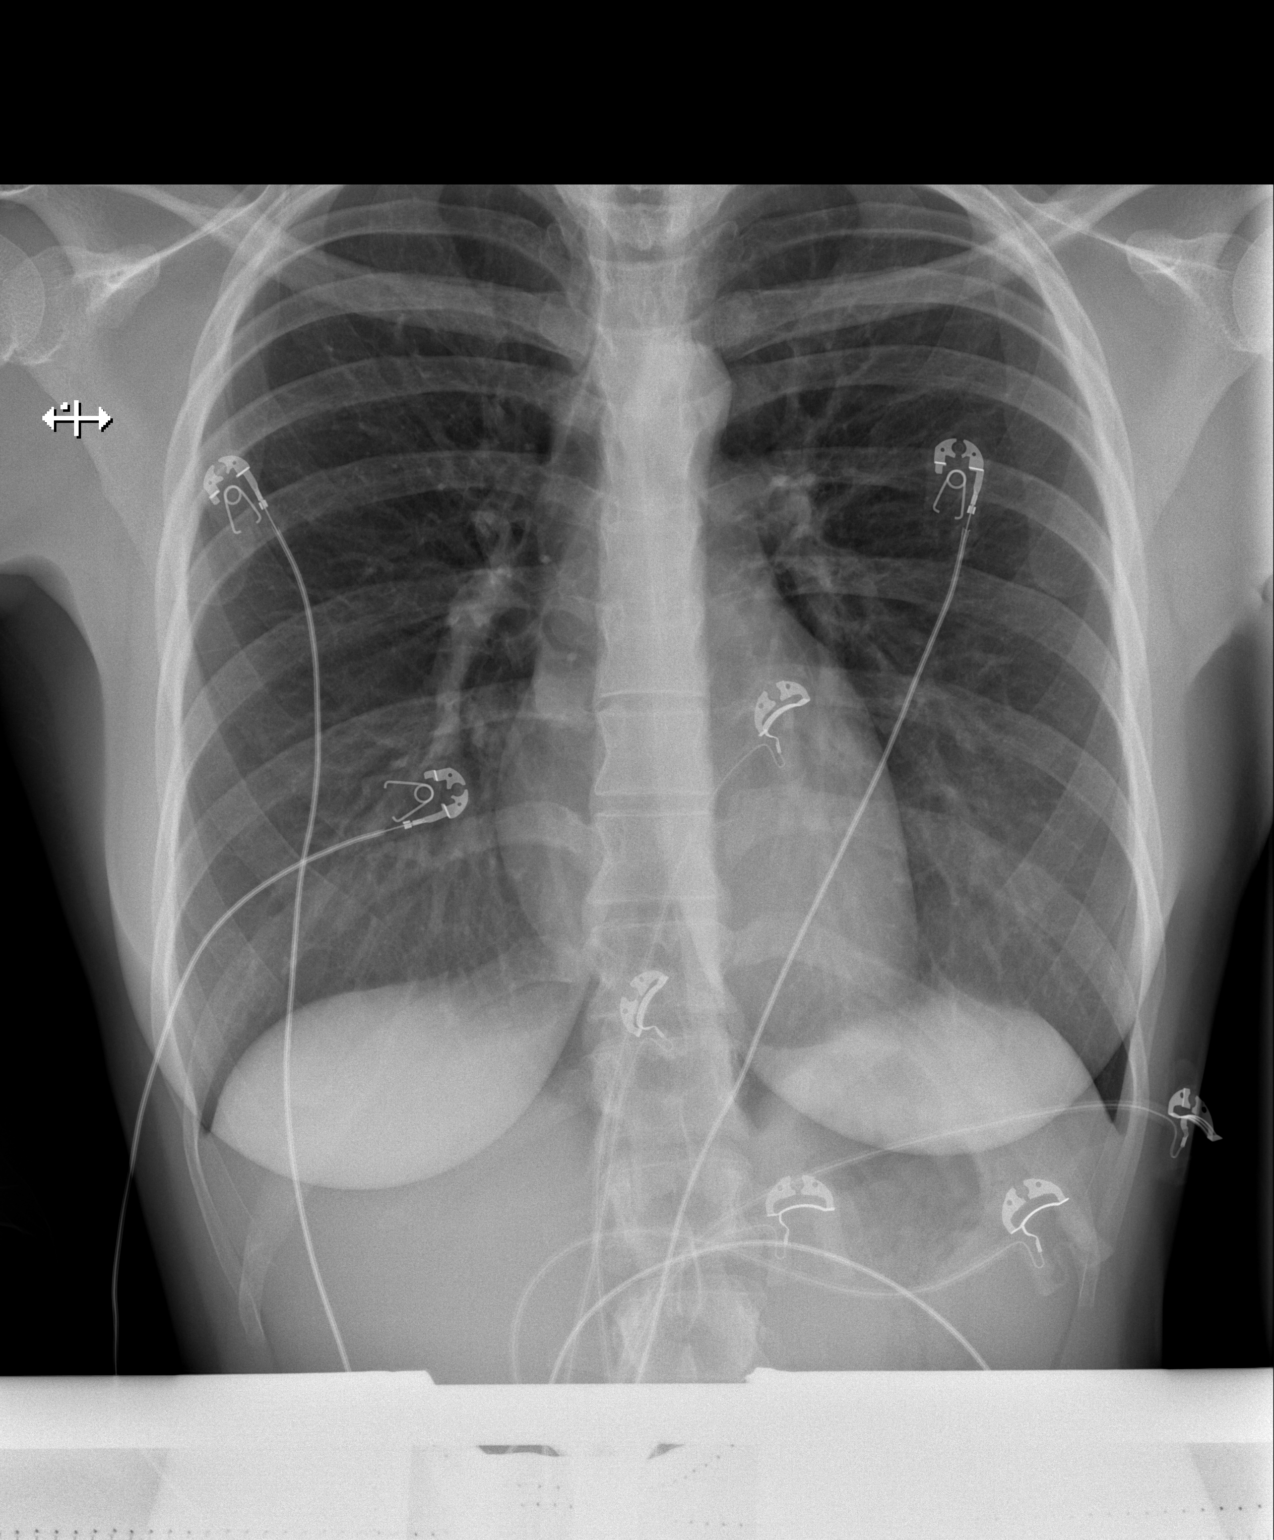

[w chest lat]
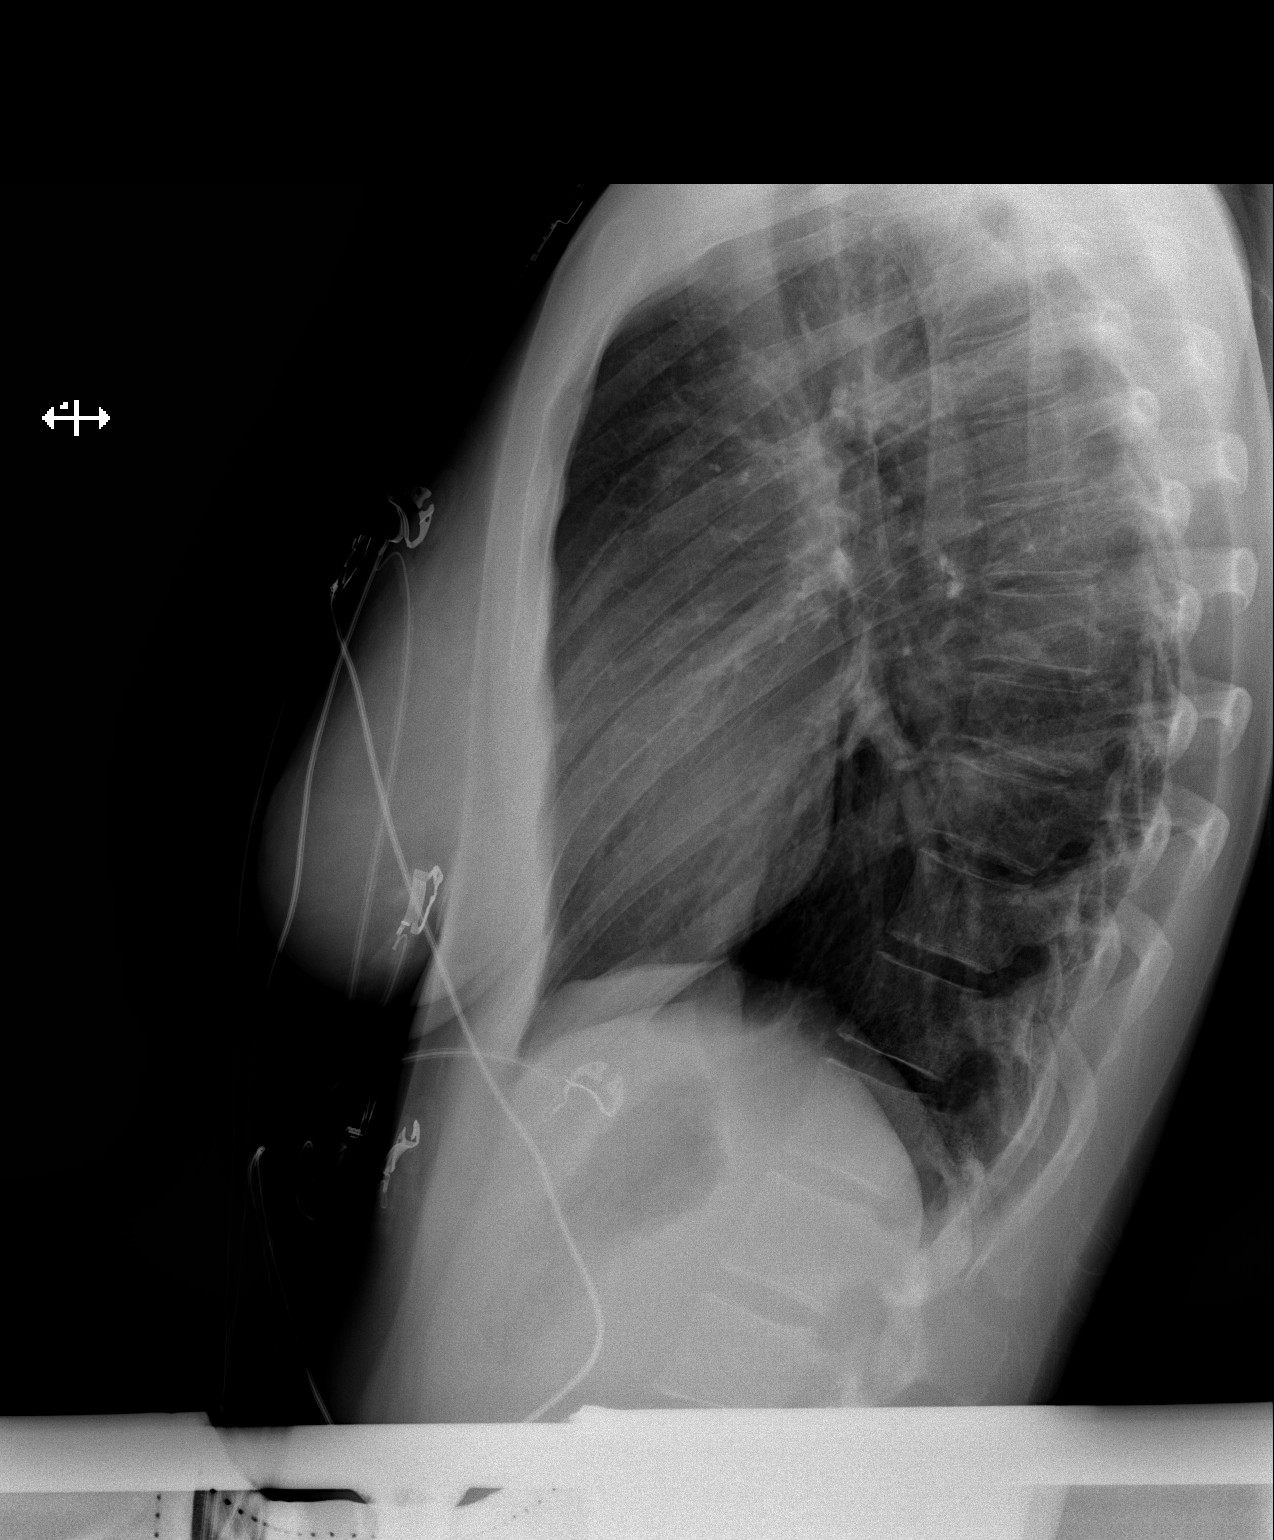

[2 of 2 positions shown; findings below may reference images not displayed]

FINDINGS: Mediastinum and hilar structures normal. Lungs are clear. Heart size
normal. No pleural effusion or pneumothorax. No acute bony
abnormality.
IMPRESSION: No acute cardiopulmonary disease.
